# Patient Record
Sex: Male | Born: 1970 | Race: Black or African American | Hispanic: No | Smoking: Never smoker
Health system: Southern US, Community
[De-identification: ages and names within clinical notes are randomized; demographics above are authoritative.]

## PROBLEM LIST (undated history)

## (undated) DIAGNOSIS — E785 Hyperlipidemia, unspecified: Secondary | ICD-10-CM

## (undated) DIAGNOSIS — R972 Elevated prostate specific antigen [PSA]: Secondary | ICD-10-CM

## (undated) DIAGNOSIS — I10 Essential (primary) hypertension: Secondary | ICD-10-CM

## (undated) DIAGNOSIS — IMO0001 Reserved for inherently not codable concepts without codable children: Secondary | ICD-10-CM

## (undated) DIAGNOSIS — C801 Malignant (primary) neoplasm, unspecified: Secondary | ICD-10-CM

## (undated) DIAGNOSIS — R030 Elevated blood-pressure reading, without diagnosis of hypertension: Secondary | ICD-10-CM

## (undated) HISTORY — DX: Hyperlipidemia, unspecified: E78.5

## (undated) HISTORY — DX: Elevated blood-pressure reading, without diagnosis of hypertension: R03.0

## (undated) HISTORY — DX: Reserved for inherently not codable concepts without codable children: IMO0001

## (undated) HISTORY — PX: WISDOM TOOTH EXTRACTION: SHX21

## (undated) HISTORY — PX: PROSTATE BIOPSY: SHX241

---
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
## 2003-07-21 ENCOUNTER — Emergency Department (HOSPITAL_COMMUNITY): Admission: EM | Admit: 2003-07-21 | Discharge: 2003-07-22 | Payer: Self-pay | Admitting: Emergency Medicine

## 2003-07-25 ENCOUNTER — Ambulatory Visit (HOSPITAL_BASED_OUTPATIENT_CLINIC_OR_DEPARTMENT_OTHER): Admission: RE | Admit: 2003-07-25 | Discharge: 2003-07-25 | Payer: Self-pay | Admitting: Orthopedic Surgery

## 2005-02-12 ENCOUNTER — Emergency Department (HOSPITAL_COMMUNITY): Admission: EM | Admit: 2005-02-12 | Discharge: 2005-02-12 | Payer: Self-pay | Admitting: Emergency Medicine

## 2005-02-16 ENCOUNTER — Emergency Department (HOSPITAL_COMMUNITY): Admission: EM | Admit: 2005-02-16 | Discharge: 2005-02-16 | Payer: Self-pay | Admitting: Emergency Medicine

## 2005-04-30 ENCOUNTER — Emergency Department (HOSPITAL_COMMUNITY): Admission: EM | Admit: 2005-04-30 | Discharge: 2005-04-30 | Payer: Self-pay | Admitting: Emergency Medicine

## 2012-02-23 ENCOUNTER — Emergency Department (HOSPITAL_COMMUNITY): Payer: No Typology Code available for payment source

## 2012-02-23 ENCOUNTER — Encounter (HOSPITAL_COMMUNITY): Payer: Self-pay | Admitting: *Deleted

## 2012-02-23 ENCOUNTER — Emergency Department (HOSPITAL_COMMUNITY)
Admission: EM | Admit: 2012-02-23 | Discharge: 2012-02-23 | Disposition: A | Discharge status: Home / Self Care | Payer: No Typology Code available for payment source | Attending: Emergency Medicine | Admitting: Emergency Medicine

## 2012-02-23 DIAGNOSIS — Y92410 Unspecified street and highway as the place of occurrence of the external cause: Secondary | ICD-10-CM | POA: Insufficient documentation

## 2012-02-23 DIAGNOSIS — Y9389 Activity, other specified: Secondary | ICD-10-CM | POA: Insufficient documentation

## 2012-02-23 DIAGNOSIS — S0993XA Unspecified injury of face, initial encounter: Secondary | ICD-10-CM | POA: Insufficient documentation

## 2012-02-23 DIAGNOSIS — S161XXA Strain of muscle, fascia and tendon at neck level, initial encounter: Secondary | ICD-10-CM

## 2012-02-23 DIAGNOSIS — S139XXA Sprain of joints and ligaments of unspecified parts of neck, initial encounter: Secondary | ICD-10-CM | POA: Insufficient documentation

## 2012-02-23 MED ORDER — HYDROCODONE-ACETAMINOPHEN 5-325 MG PO TABS: 1.0000 | ORAL_TABLET | Freq: Four times a day (QID) | ORAL | Status: DC | PRN

## 2012-02-23 MED ORDER — IBUPROFEN 600 MG PO TABS: 600.0000 mg | ORAL_TABLET | Freq: Three times a day (TID) | ORAL | Status: DC | PRN

## 2012-02-23 MED ORDER — HYDROCODONE-ACETAMINOPHEN 5-325 MG PO TABS
2.0000 | ORAL_TABLET | Freq: Once | ORAL | Status: AC
  Administered 2012-02-23: 2 via ORAL
  Filled 2012-02-23: qty 2
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
## 2012-05-20 ENCOUNTER — Ambulatory Visit: Payer: 59 | Admitting: Diagnostic Neuroimaging

## 2012-05-27 ENCOUNTER — Encounter: Payer: Self-pay | Admitting: Diagnostic Neuroimaging

## 2012-05-27 ENCOUNTER — Ambulatory Visit (INDEPENDENT_AMBULATORY_CARE_PROVIDER_SITE_OTHER): Payer: 59 | Admitting: Diagnostic Neuroimaging

## 2012-05-27 VITALS — BP 144/86 | HR 69 | Temp 98.7°F | Ht 72.0 in | Wt 220.0 lb

## 2012-05-27 DIAGNOSIS — F0781 Postconcussional syndrome: Secondary | ICD-10-CM
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
## 2012-05-27 NOTE — Patient Instructions (Signed)
Post-Concussion Syndrome Post-concussion syndrome describes the symptoms that can occur after a head injury. These symptoms can last from weeks to months. CAUSES  It is not clear why some head injuries cause post-concussion syndrome. It can occur whether your head injury was mild or severe and whether you were wearing head protection or not.  SYMPTOMS  Memory difficulties.  Dizziness.  Headaches.  Double vision or blurry vision.  Sensitivity to light.  Hearing difficulties.  Depression.  Tiredness.  Weakness.  Difficulty with concentration.  Difficulty sleeping or staying asleep.  Vomiting. DIAGNOSIS  There is no test to determine whether you have post-concussion syndrome. Your caregiver may order an imaging scan of your brain, such as a CT scan, to check for other problems that may be causing your symptoms (such as severe injury inside your skull). TREATMENT  Usually, these problems disappear over time without medical care. Your caregiver may prescribe medicine to help ease your symptoms. It is important to follow up with a neurologist to evaluate your recovery and address any lingering symptoms or issues. HOME CARE INSTRUCTIONS   Only take over-the-counter or prescription medicines for pain, discomfort, or fever as directed by your caregiver. Do not take aspirin. Aspirin can slow blood clotting.  Sleep with your head slightly elevated to help with headaches.  Avoid any situation where there is potential for another head injury (football, hockey, martial arts, horseback riding). Your condition will get worse every time you experience a concussion. You should avoid these activities until you are evaluated by the appropriate follow-up caregivers.  Keep all follow-up appointments as directed by your caregiver.
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
## 2022-05-13 DIAGNOSIS — Z125 Encounter for screening for malignant neoplasm of prostate: Secondary | ICD-10-CM | POA: Diagnosis not present

## 2022-05-13 DIAGNOSIS — Z23 Encounter for immunization: Secondary | ICD-10-CM | POA: Diagnosis not present

## 2022-05-13 DIAGNOSIS — Z0000 Encounter for general adult medical examination without abnormal findings: Secondary | ICD-10-CM | POA: Diagnosis not present

## 2022-05-13 DIAGNOSIS — Z13220 Encounter for screening for lipoid disorders: Secondary | ICD-10-CM | POA: Diagnosis not present

## 2022-06-10 DIAGNOSIS — R972 Elevated prostate specific antigen [PSA]: Secondary | ICD-10-CM | POA: Diagnosis not present

## 2022-06-11 ENCOUNTER — Other Ambulatory Visit: Payer: Self-pay | Admitting: Urology

## 2022-06-11 DIAGNOSIS — R972 Elevated prostate specific antigen [PSA]: Secondary | ICD-10-CM

## 2022-07-29 ENCOUNTER — Other Ambulatory Visit: Payer: Self-pay

## 2022-08-06 ENCOUNTER — Ambulatory Visit
Admission: RE | Admit: 2022-08-06 | Discharge: 2022-08-06 | Disposition: A | Discharge status: Home / Self Care | Payer: BC Managed Care – PPO | Source: Ambulatory Visit | Attending: Urology | Admitting: Urology

## 2022-08-06 DIAGNOSIS — R972 Elevated prostate specific antigen [PSA]: Secondary | ICD-10-CM | POA: Diagnosis not present

## 2022-08-06 DIAGNOSIS — N429 Disorder of prostate, unspecified: Secondary | ICD-10-CM | POA: Diagnosis not present

## 2022-08-06 DIAGNOSIS — N400 Enlarged prostate without lower urinary tract symptoms: Secondary | ICD-10-CM | POA: Diagnosis not present

## 2022-08-06 MED ORDER — GADOPICLENOL 0.5 MMOL/ML IV SOLN
10.0000 mL | Freq: Once | INTRAVENOUS | Status: AC | PRN
  Administered 2022-08-06: 10 mL via INTRAVENOUS

## 2022-08-19 DIAGNOSIS — R972 Elevated prostate specific antigen [PSA]: Secondary | ICD-10-CM | POA: Diagnosis not present

## 2022-08-19 DIAGNOSIS — N4289 Other specified disorders of prostate: Secondary | ICD-10-CM | POA: Diagnosis not present

## 2022-08-19 DIAGNOSIS — D075 Carcinoma in situ of prostate: Secondary | ICD-10-CM | POA: Diagnosis not present

## 2022-08-19 DIAGNOSIS — C61 Malignant neoplasm of prostate: Secondary | ICD-10-CM | POA: Diagnosis not present

## 2022-08-26 DIAGNOSIS — C61 Malignant neoplasm of prostate: Secondary | ICD-10-CM | POA: Diagnosis not present

## 2022-09-03 ENCOUNTER — Telehealth: Payer: Self-pay

## 2022-09-03 NOTE — Telephone Encounter (Signed)
I called pt to introduce myself as the Coordinator of the Prostate [facility].   1. I confirmed with the patient he is aware of his referral to the clinic 8/27, arriving @ 12:30 pm.    2. I discussed the format of the clinic and the physicians he will be seeing that day.   3. I discussed where the clinic is located and how to contact me.   4. I confirmed his address and informed him I would be mailing a packet of information and forms to be completed. I asked him to bring them with him the day of his appointment.    He voiced understanding of the above. I asked him to call me if he has any questions or concerns regarding his appointments or the forms he needs to complete.

## 2022-09-14 DIAGNOSIS — D122 Benign neoplasm of ascending colon: Secondary | ICD-10-CM | POA: Diagnosis not present

## 2022-09-14 DIAGNOSIS — Z1211 Encounter for screening for malignant neoplasm of colon: Secondary | ICD-10-CM | POA: Diagnosis not present

## 2022-09-22 ENCOUNTER — Encounter: Payer: Self-pay | Admitting: Radiation Oncology

## 2022-09-22 ENCOUNTER — Ambulatory Visit
Admission: RE | Admit: 2022-09-22 | Discharge: 2022-09-22 | Disposition: A | Discharge status: Home / Self Care | Payer: BC Managed Care – PPO | Source: Ambulatory Visit | Attending: Radiation Oncology | Admitting: Radiation Oncology

## 2022-09-22 ENCOUNTER — Encounter: Payer: Self-pay | Admitting: Urology

## 2022-09-22 VITALS — BP 136/74 | HR 64 | Temp 97.3°F | Resp 18 | Ht 72.0 in | Wt 205.0 lb

## 2022-09-22 DIAGNOSIS — Z191 Hormone sensitive malignancy status: Secondary | ICD-10-CM | POA: Diagnosis not present

## 2022-09-22 DIAGNOSIS — C61 Malignant neoplasm of prostate: Secondary | ICD-10-CM

## 2022-09-22 HISTORY — DX: Elevated prostate specific antigen (PSA): R97.20

## 2022-09-22 NOTE — Progress Notes (Signed)
                               Care Plan Summary  Name: Joshua Bautista DOB: Feb 19, 1970   Your Medical Team:   Urologist -  Dr. [name], [facility]  Radiation Oncologist - Dr. Margaretmary Dys, [facility]     Recommendations: 1) Radiation   2) Surgery    * These recommendations are based on information available as of today's consult.      Recommendations may change depending on the results of further tests or exams.    Next Steps: 1) Consider all your options and contact Marisue Ivan, your nurse navigator, with any questions or treatment decision.      When appointments need to be scheduled, you will be contacted by [facility] and/or [facility].  Questions?  Please do not hesitate to call Cherlyn Cushing, BSN, RN at [phone] with any questions or concerns.  Marisue Ivan is your Oncology Nurse Navigator and is available to assist you while you're receiving your medical care at [facility].

## 2022-09-22 NOTE — Consult Note (Signed)
Multi-Disciplinary Clinic     09/22/2022   --------------------------------------------------------------------------------   Joshua Bautista  MRN: [id number]  DOB: December 07, 1970, 52 year old Male  SSN: [id number]   PRIMARY CARE:     REFERRING:  Renford Dills  PROVIDER:  Modena Slater, Radene Knee, M.D.  TREATING:  [name], M.D.  LOCATION:  [facility] [phone]     --------------------------------------------------------------------------------   CC/HPI: CC: Prostate Cancer   Physician requesting consult: Dr. Verdia Kuba  PCP: Dr. Renford Dills  Location of consult: [facility] - Prostate Cancer Multidisciplinary [facility]   Joshua Bautista is a 52 year old gentleman who was found to have an elevated PSA of 6.79 prompting an MRI of the prostate on 08/06/22 that confirmed a 9 mm PI-RADS 3 lesion of the left mid/base peripheral zone. An MR/US fusion biopsy was then performed on 08/19/22 that confirmed Gleason 3+4=7 adenocarcinoma with 5 out of 15 biopsies positive for malignancy including 4 out of 12 systematic biopsy cores and 1 out of 3 targeted biopsies.   Family history: None.   Imaging studies: MRI (08/06/22) - No EPE, SVI, LAD, or bone lesions.   PMH: He has no medical comorbidities.  PSH: No abdominal surgeries.   TNM stage: cT1c N0 Mx  PSA: 6.79  Gleason score: 3+4=7 (GG 2)  Biopsy (08/19/22): 5/15 cores positive  Left: L lateral apex (10%, 3+4=7), L apex (20%, 3+4=7), L mid (5%, 3+4=7), L lateral base (5%, 3+3=6)  Right: Benign  ROI: 1/3 cores (5%, 3+3=6)  Prostate volume: 73.7 cc   Nomogram  OC disease: 68%  EPE: 30%  SVI: 3%  LNI: 4%  PFS (5 year, 10 year): 82%, 71%   Urinary function: IPSS is 11.  Erectile function: SHIM score is 25.     ALLERGIES: No Allergies    MEDICATIONS: No Medications    GU PSH: Prostate Needle Biopsy - 08/19/2022     NON-GU PSH: Surgical Pathology, Gross And Microscopic Examination For Prostate Needle -  08/19/2022 Visit Complexity (formerly GPC1X) - 06/10/2022     GU PMH: Prostate Cancer - 08/26/2022 Elevated PSA - 08/19/2022, - 06/10/2022 Encounter for Prostate Cancer screening - 06/10/2022    NON-GU PMH: No Non-GU PMH    FAMILY HISTORY: 2 daughters - Daughter 2 sons - Son   SOCIAL HISTORY: Marital Status: Divorced Does not use smokeless tobacco. Has never drank.  Drinks 1 caffeinated drink per day.    REVIEW OF SYSTEMS:    GU Review Male:   Patient denies frequent urination, hard to postpone urination, burning/ pain with urination, get up at night to urinate, leakage of urine, stream starts and stops, trouble starting your streams, and have to strain to urinate .  Gastrointestinal (Upper):   Patient denies nausea and vomiting.  Gastrointestinal (Lower):   Patient denies diarrhea and constipation.  Constitutional:   Patient denies fever, night sweats, weight loss, and fatigue.  Skin:   Patient denies skin rash/ lesion and itching.  Eyes:   Patient denies double vision and blurred vision.  Ears/ Nose/ Throat:   Patient denies sore throat and sinus problems.  Hematologic/Lymphatic:   Patient denies swollen glands and easy bruising.  Cardiovascular:   Patient denies leg swelling and chest pains.  Respiratory:   Patient denies cough and shortness of breath.  Endocrine:   Patient denies excessive thirst.  Musculoskeletal:   Patient denies back pain and joint pain.  Neurological:   Patient denies headaches and dizziness.  Psychologic:   Patient denies depression and anxiety.   VITAL SIGNS: None   MULTI-SYSTEM PHYSICAL EXAMINATION:    Constitutional: Well-nourished. No physical deformities. Normally developed. Good grooming.     Complexity of Data:  Lab Test Review:   PSA  Records Review:   Pathology Reports, Previous Patient Records   06/10/22  PSA  Total PSA 6.79 ng/mL    PROCEDURES: None   ASSESSMENT:      ICD-10 Details  1 GU:   Prostate Cancer - C61    PLAN:            Document Letter(s):  Created for Patient: Clinical Summary         Notes:   1. Favorable intermediate risk prostate cancer: I had a detailed discussion with Joshua Bautista today regarding his prostate cancer diagnosis. The patient was counseled about the natural history of prostate cancer and the standard treatment options that are available for prostate cancer. It was explained to him how his age and life expectancy, clinical stage, Gleason score/prognostic grade group, and PSA (and PSA density) affect his prognosis, the decision to proceed with additional staging studies, as well as how that information influences recommended treatment strategies. We discussed the roles for active surveillance, radiation therapy, surgical therapy, androgen deprivation, as well as ablative therapy and other investigational options for the treatment of prostate cancer as appropriate to his individual cancer situation. We discussed the risks and benefits of these options with regard to their impact on cancer control and also in terms of potential adverse events, complications, and impact on quality of life particularly related to urinary and sexual function. The patient was encouraged to ask questions throughout the discussion today and all questions were answered to his stated satisfaction. In addition, the patient was provided with and/or directed to appropriate resources and literature for further education about prostate cancer and treatment options. We discussed surgical therapy for prostate cancer including the different available surgical approaches. We discussed, in detail, the risks and expectations of surgery with regard to cancer control, urinary control, and erectile function as well as the expected postoperative recovery process. Additional risks of surgery including but not limited to bleeding, infection, hernia formation, nerve damage, lymphocele formation, bowel/rectal injury potentially necessitating colostomy,  damage to the urinary tract resulting in urine leakage, urethral stricture, and the cardiopulmonary risks such as myocardial infarction, stroke, death, venothromboembolism, etc. were explained. The risk of open surgical conversion for robotic/laparoscopic prostatectomy was also discussed.   He is well-informed and did meet with Dr. Kathrynn Running earlier today. He appears to be leaning toward surgical therapy but is not ready to make a final decision. If he elects to proceed with surgery, he is interested in possibly delaying this until January. My tentative plan would be to perform a bilateral nerve sparing robot-assisted laparoscopic radical prostatectomy and bilateral pelvic lymphadenectomy. I will plan open follow-up with me preoperatively for an exam within a month of his prostatectomy.   CC: Dr. Windy Fast polite  Dr. Verdia Kuba  Dr. Margaretmary Dys    E & M CODES: We spent 52 minutes dedicated to evaluation and management time, including face to face interaction, discussions on coordination of care, documentation, result review, and discussion with others as applicable.

## 2022-09-22 NOTE — Progress Notes (Signed)
Radiation Oncology         [phone]) [phone] ________________________________  Multidisciplinary Prostate Cancer Clinic  Initial Radiation Oncology Consultation  Name: Joshua Bautista MRN: [id number]  Date: 09/22/2022  DOB: 03/23/1970  WU:[id number], Windy Fast, MD  Crista Elliot, MD   REFERRING PHYSICIAN: Crista Elliot, MD  DIAGNOSIS: 52 y.o. gentleman with stage T1c adenocarcinoma of the prostate with a Gleason's score of 3+4 and a PSA of 6.79    ICD-10-CM   1. Malignant neoplasm of prostate (HCC)  C61       HISTORY OF PRESENT ILLNESS::Joshua Bautista is a 52 y.o. gentleman.  He was noted to have an elevated PSA of 9.83 on routine labs by his primary care physician, Dr. Nehemiah Settle.  Accordingly, he was referred for evaluation in urology by Dr. Alvester Morin on 06/10/22,  digital rectal examination performed at that time showed no nodules or induration. A repeat PSA obtained that day showed a decrease but remained elevated at 6.79.  Therefore, he underwent prostate MRI on 08/06/22 showing a small PI-RADS 3 lesion in the left posterolateral peripheral zone of the base and mid gland.          The patient proceeded to MRI fusion biopsy of the prostate on 08/19/22.  The prostate volume measured 73.71 cc.  Out of 15 core biopsies, 5 were positive.  The maximum Gleason score was 3+4, and this was seen in the left apex, left mid (small focus), and left apex lateral. Additionally, small foci of Gleason 3+3 were seen in the left base lateral and one sample from the MRI ROI.    The patient reviewed the biopsy results with his urologist and he has kindly been referred today to the multidisciplinary prostate cancer clinic for presentation of pathology and radiology studies in our conference for discussion of potential radiation treatment options and clinical evaluation.  PREVIOUS RADIATION THERAPY: No  PAST MEDICAL HISTORY:  has a past medical history of Elevated blood pressure and Hyperlipidemia.     PAST SURGICAL HISTORY:No past surgical history on file.  FAMILY HISTORY: family history is not on file.  SOCIAL HISTORY:  reports that he has never smoked. He does not have any smokeless tobacco history on file. He reports current alcohol use. He reports that he does not use drugs.  ALLERGIES: Patient has no known allergies.  MEDICATIONS:  Current Outpatient Medications  Medication Sig Dispense Refill   ibuprofen (ADVIL,MOTRIN) 600 MG tablet Take 1 tablet (600 mg total) by mouth every 8 (eight) hours as needed for pain. Take with food. 20 tablet 0   No current facility-administered medications for this encounter.    REVIEW OF SYSTEMS:  On review of systems, the patient reports that he is doing well overall. He denies any chest pain, shortness of breath, cough, fevers, chills, night sweats, unintended weight changes. He denies any bowel disturbances, and denies abdominal pain, nausea or vomiting. He denies any new musculoskeletal or joint aches or pains. His IPSS was 11, indicating moderate urinary symptoms. His SHIM was 25, indicating he does not have erectile dysfunction. A complete review of systems is obtained and is otherwise negative.   PHYSICAL EXAM:  Wt Readings from Last 3 Encounters:  05/27/12 220 lb (99.8 kg)   Temp Readings from Last 3 Encounters:  05/27/12 98.7 F (37.1 C) (Oral)  02/23/12 97.4 F (36.3 C) (Oral)   BP Readings from Last 3 Encounters:  05/27/12 (!) 144/86  02/23/12 137/81   Pulse Readings from  Last 3 Encounters:  05/27/12 69  02/23/12 86    /10  In general this is a well appearing African-American man in no acute distress. He's alert and oriented x4 and appropriate throughout the examination. Cardiopulmonary assessment is negative for acute distress and he exhibits normal effort.    KPS = 100  100 - Normal; no complaints; no evidence of disease. 90   - Able to carry on normal activity; minor signs or symptoms of disease. 80   - Normal  activity with effort; some signs or symptoms of disease. 55   - Cares for self; unable to carry on normal activity or to do active work. 60   - Requires occasional assistance, but is able to care for most of his personal needs. 50   - Requires considerable assistance and frequent medical care. 40   - Disabled; requires special care and assistance. 30   - Severely disabled; hospital admission is indicated although death not imminent. 20   - Very sick; hospital admission necessary; active supportive treatment necessary. 10   - Moribund; fatal processes progressing rapidly. 0     - Dead  Karnofsky DA, Abelmann WH, Craver LS and Burchenal JH [phone]) The use of the nitrogen mustards in the palliative treatment of carcinoma: with particular reference to bronchogenic carcinoma Cancer 1 [phone]   LABORATORY DATA:  No results found for: "WBC", "HGB", "HCT", "MCV", "PLT" No results found for: "NA", "K", "CL", "CO2" No results found for: "ALT", "AST", "GGT", "ALKPHOS", "BILITOT"   RADIOGRAPHY: No results found.    IMPRESSION/PLAN: 52 y.o. gentleman with Stage T1c adenocarcinoma of the prostate with a Gleason score of 3+4 and a PSA of 6.79.    We discussed the patient's workup and outlined the nature of prostate cancer in this setting. The patient's T stage, Gleason's score, and PSA put him into the favorable intermediate risk group. Accordingly, he is eligible for a variety of potential treatment options including brachytherapy, 5.5 weeks of external radiation, or prostatectomy. We discussed the available radiation techniques, and focused on the details and logistics of delivery. The patient is not an ideal candidate for brachytherapy with a prostate volume of 74 cc.  Therefore, we discussed and outlined the risks, benefits, short and long-term effects associated with daily external beam radiotherapy and compared and contrasted these with prostatectomy. We discussed the role of SpaceOAR gel in reducing the  rectal toxicity associated with radiotherapy.   The patient focused most of his questions and interest in robotic-assisted laparoscopic radical prostatectomy.  We discussed some of the potential advantages of surgery including surgical staging, the availability of salvage radiotherapy to the prostatic fossa, and the confidence associated with immediate biochemical response.  We discussed some of the potential proven indications for postoperative radiotherapy including positive margins, extracapsular extension, and seminal vesicle involvement. We also talked about some of the other potential findings leading to a recommendation for radiotherapy including a non-zero postoperative PSA and positive lymph nodes. He appears to have a good understanding of his disease and our treatment recommendations which are of curative intent.  He was encouraged to ask questions that were answered to his stated satisfaction.  At the end of the conversation the patient remains undecided but appears to be leaning towards proceeding with robotic assisted laparoscopic prostatectomy.  He would like to take some additional time to consider his options prior to committing to treatment and he has our contact information so that he can let us know once he reaches a  final decision and we will proceed with treatment planning accordingly at that time.  We enjoyed meeting him today look forward to continuing to participate in his care should he ultimately elect to proceed with 5 and half weeks of daily external beam radiation.  He knows that he is welcome to call at anytime with any further questions or concerns related to the treatment options discussed today.  We personally spent 60 minutes in this encounter including chart review, reviewing radiological studies, meeting face-to-face with the patient, entering orders and completing documentation.    Marguarite Arbour, PA-C    Margaretmary Dys, MD  [facility]  Radiation  Oncology Direct Dial: [phone]  Fax: [phone] [email or web address]  Skype  LinkedIn   This document serves as a record of services personally performed by Margaretmary Dys, MD and Marcello Fennel, PA-C. It was created on their behalf by Mickie Bail, a trained medical scribe. The creation of this record is based on the scribe's personal observations and the provider's statements to them. This document has been checked and approved by the attending provider.

## 2022-09-23 ENCOUNTER — Emergency Department (HOSPITAL_BASED_OUTPATIENT_CLINIC_OR_DEPARTMENT_OTHER)
Admission: EM | Admit: 2022-09-23 | Discharge: 2022-09-23 | Disposition: A | Discharge status: Home / Self Care | Payer: BC Managed Care – PPO | Attending: Emergency Medicine | Admitting: Emergency Medicine

## 2022-09-23 ENCOUNTER — Emergency Department (HOSPITAL_BASED_OUTPATIENT_CLINIC_OR_DEPARTMENT_OTHER): Payer: BC Managed Care – PPO

## 2022-09-23 ENCOUNTER — Encounter (HOSPITAL_BASED_OUTPATIENT_CLINIC_OR_DEPARTMENT_OTHER): Payer: Self-pay

## 2022-09-23 ENCOUNTER — Other Ambulatory Visit (HOSPITAL_BASED_OUTPATIENT_CLINIC_OR_DEPARTMENT_OTHER): Payer: Self-pay

## 2022-09-23 ENCOUNTER — Other Ambulatory Visit: Payer: Self-pay

## 2022-09-23 DIAGNOSIS — K529 Noninfective gastroenteritis and colitis, unspecified: Secondary | ICD-10-CM | POA: Diagnosis not present

## 2022-09-23 DIAGNOSIS — Z8546 Personal history of malignant neoplasm of prostate: Secondary | ICD-10-CM | POA: Diagnosis not present

## 2022-09-23 DIAGNOSIS — R109 Unspecified abdominal pain: Secondary | ICD-10-CM | POA: Diagnosis not present

## 2022-09-23 DIAGNOSIS — R1010 Upper abdominal pain, unspecified: Secondary | ICD-10-CM | POA: Diagnosis not present

## 2022-09-23 DIAGNOSIS — N200 Calculus of kidney: Secondary | ICD-10-CM | POA: Diagnosis not present

## 2022-09-23 DIAGNOSIS — I10 Essential (primary) hypertension: Secondary | ICD-10-CM | POA: Insufficient documentation

## 2022-09-23 DIAGNOSIS — K8020 Calculus of gallbladder without cholecystitis without obstruction: Secondary | ICD-10-CM | POA: Diagnosis not present

## 2022-09-23 DIAGNOSIS — K429 Umbilical hernia without obstruction or gangrene: Secondary | ICD-10-CM | POA: Diagnosis not present

## 2022-09-23 LAB — CBC WITH DIFFERENTIAL/PLATELET
Abs Immature Granulocytes: 0.01 10*3/uL (ref 0.00–0.07)
Basophils Absolute: 0 10*3/uL (ref 0.0–0.1)
Basophils Relative: 0 %
Eosinophils Absolute: 0 10*3/uL (ref 0.0–0.5)
Eosinophils Relative: 0 %
HCT: 41.7 % (ref 39.0–52.0)
Hemoglobin: 14.3 g/dL (ref 13.0–17.0)
Immature Granulocytes: 0 %
Lymphocytes Relative: 19 %
Lymphs Abs: 1 10*3/uL (ref 0.7–4.0)
MCH: 29.7 pg (ref 26.0–34.0)
MCHC: 34.3 g/dL (ref 30.0–36.0)
MCV: 86.5 fL (ref 80.0–100.0)
Monocytes Absolute: 0.2 10*3/uL (ref 0.1–1.0)
Monocytes Relative: 4 %
Neutro Abs: 4.2 10*3/uL (ref 1.7–7.7)
Neutrophils Relative %: 77 %
Platelets: 193 10*3/uL (ref 150–400)
RBC: 4.82 MIL/uL (ref 4.22–5.81)
RDW: 13.4 % (ref 11.5–15.5)
WBC: 5.5 10*3/uL (ref 4.0–10.5)
nRBC: 0 % (ref 0.0–0.2)

## 2022-09-23 LAB — COMPREHENSIVE METABOLIC PANEL
ALT: 10 U/L (ref 0–44)
AST: 13 U/L — ABNORMAL LOW (ref 15–41)
Albumin: 4.1 g/dL (ref 3.5–5.0)
Alkaline Phosphatase: 46 U/L (ref 38–126)
Anion gap: 8 (ref 5–15)
BUN: 15 mg/dL (ref 6–20)
CO2: 27 mmol/L (ref 22–32)
Calcium: 9.4 mg/dL (ref 8.9–10.3)
Chloride: 105 mmol/L (ref 98–111)
Creatinine, Ser: 1.17 mg/dL (ref 0.61–1.24)
GFR, Estimated: >60 mL/min (ref >60)
Glucose, Bld: 131 mg/dL — ABNORMAL HIGH (ref 70–99)
Potassium: 3.9 mmol/L (ref 3.5–5.1)
Sodium: 140 mmol/L (ref 135–145)
Total Bilirubin: 0.7 mg/dL (ref 0.3–1.2)
Total Protein: 7.9 g/dL (ref 6.5–8.1)

## 2022-09-23 LAB — URINALYSIS, ROUTINE W REFLEX MICROSCOPIC
Bilirubin Urine: NEGATIVE
Glucose, UA: NEGATIVE mg/dL
Hgb urine dipstick: NEGATIVE
Ketones, ur: NEGATIVE mg/dL
Leukocytes,Ua: NEGATIVE
Nitrite: NEGATIVE
Protein, ur: NEGATIVE mg/dL
Specific Gravity, Urine: 1.019 (ref 1.005–1.030)
pH: 7.5 (ref 5.0–8.0)

## 2022-09-23 LAB — LACTIC ACID, PLASMA: Lactic Acid, Venous: 1.7 mmol/L (ref 0.5–1.9)

## 2022-09-23 LAB — LIPASE, BLOOD: Lipase: 42 U/L (ref 11–51)

## 2022-09-23 MED ORDER — IOHEXOL 300 MG/ML  SOLN
100.0000 mL | Freq: Once | INTRAMUSCULAR | Status: AC | PRN
  Administered 2022-09-23: 100 mL via INTRAVENOUS

## 2022-09-23 MED ORDER — FENTANYL CITRATE PF 50 MCG/ML IJ SOSY
50.0000 ug | PREFILLED_SYRINGE | Freq: Once | INTRAMUSCULAR | Status: AC
  Administered 2022-09-23: 50 ug via INTRAVENOUS
  Filled 2022-09-23: qty 1

## 2022-09-23 MED ORDER — MORPHINE SULFATE (PF) 4 MG/ML IV SOLN
4.0000 mg | Freq: Once | INTRAVENOUS | Status: AC
  Administered 2022-09-23: 4 mg via INTRAVENOUS
  Filled 2022-09-23: qty 1

## 2022-09-23 MED ORDER — DICYCLOMINE HCL 10 MG PO CAPS
10.0000 mg | ORAL_CAPSULE | Freq: Once | ORAL | Status: AC
  Administered 2022-09-23: 10 mg via ORAL
  Filled 2022-09-23: qty 1

## 2022-09-23 MED ORDER — IBUPROFEN 600 MG PO TABS: 600.0000 mg | ORAL_TABLET | Freq: Four times a day (QID) | ORAL | 0 refills | Status: DC | PRN

## 2022-09-23 MED ORDER — SULFAMETHOXAZOLE-TRIMETHOPRIM 800-160 MG PO TABS: 1.0000 | ORAL_TABLET | Freq: Two times a day (BID) | ORAL | 0 refills | Status: AC

## 2022-09-23 MED ORDER — OXYCODONE-ACETAMINOPHEN 5-325 MG PO TABS
1.0000 | ORAL_TABLET | Freq: Once | ORAL | Status: AC
  Administered 2022-09-23: 1 via ORAL
  Filled 2022-09-23: qty 1

## 2022-09-23 MED ORDER — KETOROLAC TROMETHAMINE 15 MG/ML IJ SOLN
15.0000 mg | Freq: Once | INTRAMUSCULAR | Status: AC
  Administered 2022-09-23: 15 mg via INTRAVENOUS
  Filled 2022-09-23: qty 1

## 2022-09-23 MED ORDER — SODIUM CHLORIDE 0.9 % IV SOLN
Freq: Once | INTRAVENOUS | Status: AC
Start: 2022-09-23 — End: 2022-09-23

## 2022-09-23 MED ORDER — OXYCODONE HCL 5 MG PO TABS: 5.0000 mg | ORAL_TABLET | Freq: Four times a day (QID) | ORAL | 0 refills | Status: DC | PRN

## 2022-09-23 MED ORDER — ONDANSETRON HCL 4 MG/2ML IJ SOLN
4.0000 mg | Freq: Once | INTRAMUSCULAR | Status: AC
  Administered 2022-09-23: 4 mg via INTRAVENOUS
  Filled 2022-09-23: qty 2

## 2022-09-23 MED ORDER — SODIUM CHLORIDE 0.9 % IV SOLN
3.0000 g | Freq: Once | INTRAVENOUS | Status: AC
  Administered 2022-09-23: 3 g via INTRAVENOUS

## 2022-09-23 NOTE — ED Notes (Signed)
Recollect light green sent to lab.  

## 2022-09-23 NOTE — ED Notes (Signed)
 RN reviewed discharge instructions with pt. Pt verbalized understanding and had no further questions. VSS upon discharge.  

## 2022-09-23 NOTE — ED Provider Notes (Signed)
[place] EMERGENCY DEPARTMENT AT [facility] Provider Note   CSN: [id number] Arrival date & time: 09/23/22  1027     History  Chief Complaint  Patient presents with   Abdominal Pain    Joshua Bautista is a 52 y.o. male.   Abdominal Pain   52 year old male presents emergency department with complaints of upper abdominal pain.  Patient reports acute onset upper abdominal pain around 4 AM this morning.  Reports pain mostly right upper abdomen with some radiation across his upper abdomen.  States he has not had symptoms like this before.  Reports feelings of nausea with no emesis.  Denies any fever, chills, chest pain, shortness of breath, urinary symptoms, change in bowel habits.  Patient reports last bowel movement this morning and regular per patient.  Denies any history of abdominal surgeries.  Past medical history significant for malignant prostate cancer, hyperlipidemia, hypertension  Home Medications Prior to Admission medications   Medication Sig Start Date End Date Taking? Authorizing Provider  ibuprofen (ADVIL) 600 MG tablet Take 1 tablet (600 mg total) by mouth every 6 (six) hours as needed. 09/23/22  Yes Sherian Maroon A, PA  oxyCODONE (ROXICODONE) 5 MG immediate release tablet Take 1 tablet (5 mg total) by mouth every 6 (six) hours as needed for severe pain. 09/23/22  Yes Sherian Maroon A, PA  sulfamethoxazole-trimethoprim (BACTRIM DS) 800-160 MG tablet Take 1 tablet by mouth 2 (two) times daily for 6 days. 09/23/22 09/29/22 Yes Peter Garter, PA      Allergies    Patient has no known allergies.    Review of Systems   Review of Systems  Gastrointestinal:  Positive for abdominal pain.  All other systems reviewed and are negative.   Physical Exam Updated Vital Signs BP (!) 143/83   Pulse 62   Temp 98.6 F (37 C) (Oral)   Resp 19   Ht 6' (1.829 m)   Wt 93 kg   SpO2 96%   BMI 27.80 kg/m  Physical Exam Vitals and nursing note reviewed.   Constitutional:      General: He is not in acute distress.    Appearance: He is well-developed.  HENT:     Head: Normocephalic and atraumatic.  Eyes:     Conjunctiva/sclera: Conjunctivae normal.  Cardiovascular:     Rate and Rhythm: Normal rate and regular rhythm.     Heart sounds: No murmur heard. Pulmonary:     Effort: Pulmonary effort is normal. No respiratory distress.     Breath sounds: Normal breath sounds.  Abdominal:     Palpations: Abdomen is soft.     Tenderness: There is abdominal tenderness in the right upper quadrant. There is guarding. There is no right CVA tenderness, left CVA tenderness or rebound.  Musculoskeletal:        General: No swelling.     Cervical back: Neck supple.  Skin:    General: Skin is warm and dry.     Capillary Refill: Capillary refill takes less than 2 seconds.  Neurological:     Mental Status: He is alert.  Psychiatric:        Mood and Affect: Mood normal.     ED Results / Procedures / Treatments   Labs (all labs ordered are listed, but only abnormal results are displayed) Labs Reviewed  URINALYSIS, ROUTINE W REFLEX MICROSCOPIC - Abnormal; Notable for the following components:      Result Value   Color, Urine COLORLESS (*)  All other components within normal limits  COMPREHENSIVE METABOLIC PANEL - Abnormal; Notable for the following components:   Glucose, Bld 131 (*)    AST 13 (*)    All other components within normal limits  CBC WITH DIFFERENTIAL/PLATELET  LIPASE, BLOOD  LACTIC ACID, PLASMA    EKG None  Radiology CT ABDOMEN PELVIS W CONTRAST  Result Date: 09/23/2022 CLINICAL DATA:  Severe abdominal pain since this morning EXAM: CT ABDOMEN AND PELVIS WITH CONTRAST TECHNIQUE: Multidetector CT imaging of the abdomen and pelvis was performed using the standard protocol following bolus administration of intravenous contrast. RADIATION DOSE REDUCTION: This exam was performed according to the departmental dose-optimization program  which includes automated exposure control, adjustment of the mA and/or kV according to patient size and/or use of iterative reconstruction technique. CONTRAST:  OMNIPAQUE IOHEXOL 300 MG/ML  SOLN COMPARISON:  None Available. FINDINGS: Lower chest: No acute abnormality. Bibasilar scarring or atelectasis. Coronary artery calcifications. Hepatobiliary: No solid liver abnormality is seen. Gallstones with vacuum phenomenon. No gallbladder wall thickening, or biliary dilatation. Pancreas: Unremarkable. No pancreatic ductal dilatation or surrounding inflammatory changes. Spleen: Normal in size without significant abnormality. Adrenals/Urinary Tract: Adrenal glands are unremarkable. Small nonobstructive calculi of the superior pole of the right kidney. No left-sided calculi, ureteral calculi, or hydronephrosis. Numerous bilateral benign renal cortical cysts and subcentimeter lesions too small to characterize although almost certainly additional cysts, requiring no specific further follow-up or characterization. Mildly distended urinary bladder. Stomach/Bowel: Stomach is within normal limits. Appendix appears normal. Mild, long segment colon wall thickening, mucosal hyperenhancement, and adjacent vascular combing involving the hepatic flexure through the descending colon (series 2, image 37, series 5, image 82). Vascular/Lymphatic: Aortic atherosclerosis. No enlarged abdominal or pelvic lymph nodes. Reproductive: Prostatomegaly. Mild fat stranding about the prostate (series 2, image 79). Other: Fat containing umbilical hernia.  No ascites. Musculoskeletal: No acute or significant osseous findings. IMPRESSION: 1. Mild, nonspecific infectious, inflammatory, or ischemic colitis involving the hepatic flexure through the descending colon. 2. Prostatomegaly with mild fat stranding about the prostate, suggesting prostatitis. Mildly distended urinary bladder. Correlate for urinary retention. 3. Cholelithiasis without evidence  of acute cholecystitis. 4. Nonobstructive right nephrolithiasis. 5. Coronary artery disease. Aortic Atherosclerosis (ICD10-I70.0). Electronically Signed   By: Jearld Lesch M.D.   On: 09/23/2022 12:44   US Abdomen Limited RUQ (LIVER/GB)  Result Date: 09/23/2022 CLINICAL DATA:  Right upper pain EXAM: ULTRASOUND ABDOMEN LIMITED RIGHT UPPER QUADRANT COMPARISON:  None Available. FINDINGS: Gallbladder: Multiple calcified gallstones are noted measuring up to 2.2 cm as well as layering sludge. There is no gallbladder wall thickening or pericholecystic fluid. Common bile duct: Diameter: 5 mm Liver: No focal lesion identified. Within normal limits in parenchymal echogenicity. Portal vein is patent on color Doppler imaging with normal direction of blood flow towards the liver. Other: None. IMPRESSION: Cholelithiasis without evidence of acute cholecystitis. Electronically Signed   By: Lesia Hausen M.D.   On: 09/23/2022 11:33    Procedures Procedures    Medications Ordered in ED Medications  0.9 %  sodium chloride infusion (0 mLs Intravenous Stopped 09/23/22 1715)  morphine (PF) 4 MG/ML injection 4 mg (4 mg Intravenous Given 09/23/22 1059)  ondansetron (ZOFRAN) injection 4 mg (4 mg Intravenous Given 09/23/22 1058)  iohexol (OMNIPAQUE) 300 MG/ML solution 100 mL (100 mLs Intravenous Contrast Given 09/23/22 1223)  fentaNYL (SUBLIMAZE) injection 50 mcg (50 mcg Intravenous Given 09/23/22 1243)  dicyclomine (BENTYL) capsule 10 mg (10 mg Oral Given 09/23/22 1457)  Ampicillin-Sulbactam (UNASYN) 3  g in sodium chloride 0.9 % 100 mL IVPB (0 g Intravenous Stopped 09/23/22 1716)  ketorolac (TORADOL) 15 MG/ML injection 15 mg (15 mg Intravenous Given 09/23/22 1637)  oxyCODONE-acetaminophen (PERCOCET/ROXICET) 5-325 MG per tablet 1 tablet (1 tablet Oral Given 09/23/22 1636)    ED Course/ Medical Decision Making/ A&P Clinical Course as of 09/24/22 1311  Wed Sep 23, 2022  1327 CT ABDOMEN PELVIS W CONTRAST After CT scan, patient  urinated 675 cc of fluid.  Very low suspicion for urinary retention. [CR]    Clinical Course User Index [CR] Peter Garter, PA                                 Medical Decision Making Amount and/or Complexity of Data Reviewed Labs: ordered. Radiology: ordered. Decision-making details documented in ED Course.  Risk Prescription drug management.   This patient presents to the ED for concern of abdominal pain, this involves an extensive number of treatment options, and is a complaint that carries with it a high risk of complications and morbidity.  The differential diagnosis includes gastritis, PUD, pancreatitis, CBD pathology, cholecystitis, SBO/LBO, volvulus, diverticulitis, appendicitis, pyelonephritis, nephrolithiasis, cystitis, malignancy   Co morbidities that complicate the patient evaluation  See HPI   Additional history obtained:  Additional history obtained from EMR External records from outside source obtained and reviewed including hospital records   Lab Tests:  I Ordered, and personally interpreted labs.  The pertinent results include: No leukocytosis.  No evidence of anemia.  Placed within range.  No Electra abnormalities.  No transaminitis.  No renal dysfunction.  UA without abnormality.  Lipase within normal limits.  Lactic acid within normal limits.  Patient declined Hemoccult   Imaging Studies ordered:  I ordered imaging studies including CT abdomen pelvis, right upper quadrant ultrasound I independently visualized and interpreted imaging which showed  CT abdomen pelvis: Nonspecific infectious/inflammatory/ischemic colitis involving hepatic flexure of through descending colon.  Prostatomegaly with mild fat stranding around prostate.  Mildly distended bladder.  Cholelithiasis without evidence of acute cholecystitis.  Nonobstructing right nephrolithiasis.  Coronary artery disease.  Aortic atherosclerosis. Right upper quadrant ultrasound: Cholelithiasis without  evidence of cholecystitis. I agree with the radiologist interpretation  Cardiac Monitoring: / EKG:  The patient was maintained on a cardiac monitor.  I personally viewed and interpreted the cardiac monitored which showed an underlying rhythm of: Sinus rhythm   Consultations Obtained:  I requested consultation with attending physician Dr. Wallace Cullens who is in agreement with treatment plan going forward   Problem List / ED Course / Critical interventions / Medication management  Colitis I ordered medication including fentanyl, Zofran, morphine, Bentyl, Toradol, Unasyn   Reevaluation of the patient after these medicines showed that the patient improved I have reviewed the patients home medicines and have made adjustments as needed   Social Determinants of Health:  Denies tobacco, illicit drug use   Test / Admission - Considered:  Colitis Vitals signs significant for hypertension blood pressure 143/83. Otherwise within normal range and stable throughout visit. Laboratory/imaging studies significant for: See above 52 year old male presents emergency department with complaints of upper abdominal pain acute onset earlier this morning around 4 AM.  On exam, patient with more right upper quadrant abdominal tenderness but some with epigastric tenderness.  Given diffuse nature of symptoms as well as acute onset nature of abdominal pain, CT imaging was performed.  CT imaging consistent with colitis but unsure of  exact etiology.  Given patient's history, no evidence of hematochezia/melena, lactic acid within normal limits.  Desired for assessment of Hemoccult for potential blood in stool but patient declined.  Lower suspicion for ischemic cause of patient's colitis given lack of history of vascular issues with negative lactic and lack of reported hematochezia/melena.  Patient's colitis more likely secondary to inflammatory/infectious etiology.  Patient CT imaging also showed signs of possible  prostatitis.  Will place patient on antibiotic coverage for both prostatitis as well as colitis and have him follow-up for further assessment to see if more prolonged duration of antibiotics is warranted.  Will recommend dietary modifications as well as place patient on empiric antibiotic coverage and recommend follow-up with primary care for reassessment.  Patient without meeting of SIRS criteria so sepsis protocol was not performed.  Treatment plan discussed at length with patient and he acknowledged understanding was agreeable to said plan.  Patient overall well-appearing, afebrile in no acute distress. Worrisome signs and symptoms were discussed with the patient, and the patient acknowledged understanding to return to the ED if noticed. Patient was stable upon discharge.          Final Clinical Impression(s) / ED Diagnoses Final diagnoses:  Colitis    Rx / DC Orders ED Discharge Orders          Ordered    oxyCODONE (ROXICODONE) 5 MG immediate release tablet  Every 6 hours PRN        09/23/22 1628    ibuprofen (ADVIL) 600 MG tablet  Every 6 hours PRN        09/23/22 1628    sulfamethoxazole-trimethoprim (BACTRIM DS) 800-160 MG tablet  2 times daily        09/23/22 1628              Peter Garter, [place] 09/24/22 1311    Edwin Dada P, DO 09/29/22 1008

## 2022-09-23 NOTE — Discharge Instructions (Addendum)
As discussed, your CT scan showed evidence of colitis.  Will treat this with antibiotics in the outpatient setting.  See information attached regarding dietary changes for diagnosis of colitis.  Also send in anti-inflammatories to take as well as pain medicine for breakthrough pain.  Recommend follow-up with primary care for reassessment of your symptoms within 2 to 3 days.  Please do not hesitate to return to emergency department if the worrisome signs and symptoms we discussed become apparent.

## 2022-09-23 NOTE — ED Triage Notes (Signed)
In for eval of severe abd pain onset 0400. Denies N/V/D. Last BM this am and normal. Reports eating [employer] last pm at approx 2230. Wife at bedside.

## 2022-10-02 NOTE — Progress Notes (Signed)
RN spoke with patient to follow up since recent [facility].  Patient has requested additional appointments to review treatment options.  RN offered to answer any questions, however, patient prefers to have additional appointment.    Patient is scheduled for FUN with Marcello Fennel, PA-C on 10/15/22.  RN left message to confirm he is aware, and will plan to follow up after 9/19.

## 2022-10-15 ENCOUNTER — Ambulatory Visit
Admission: RE | Admit: 2022-10-15 | Discharge: 2022-10-15 | Disposition: A | Discharge status: Home / Self Care | Payer: BC Managed Care – PPO | Source: Ambulatory Visit | Attending: Urology | Admitting: Urology

## 2022-10-15 ENCOUNTER — Encounter: Payer: Self-pay | Admitting: Urology

## 2022-10-15 VITALS — Ht 72.0 in | Wt 210.0 lb

## 2022-10-15 DIAGNOSIS — C61 Malignant neoplasm of prostate: Secondary | ICD-10-CM | POA: Diagnosis not present

## 2022-10-15 DIAGNOSIS — Z191 Hormone sensitive malignancy status: Secondary | ICD-10-CM | POA: Diagnosis not present

## 2022-10-15 NOTE — Progress Notes (Addendum)
Nursing interview for stage T1c adenocarcinoma of the prostate with a Gleason's score of 3+4 and a PSA of 6.79  Patient identity verified x2.  Patient reports doing well. No issues conveyed at this time.  Meaningful use complete. No urinary management medications Urology- Dr. Alvester Morin ([facility])- Apt- pending  Vitals- Ht 6' (1.829 m)   Wt 210 lb (95.3 kg)   BMI 28.48 kg/m   This concludes the interaction.  Ruel Favors, LPN

## 2022-10-15 NOTE — Progress Notes (Signed)
Radiation Oncology         [phone] ________________________________  Radiation Oncology Re-Consult  Name: Joshua Bautista MRN: [id number]  Date: 10/15/2022  DOB: Jun 01, 1970  WU:[id number], Windy Fast, MD  Crista Elliot, MD   REFERRING PHYSICIAN: Crista Elliot, MD  DIAGNOSIS: 52 y.o. gentleman with stage T1c adenocarcinoma of the prostate with a Gleason's score of 3+4 and a PSA of 6.79    ICD-10-CM   1. Malignant neoplasm of prostate (HCC)  C61       HISTORY OF PRESENT ILLNESS::Joshua Bautista is a 52 y.o. gentleman.  He was noted to have an elevated PSA of 9.83 on routine labs by his primary care physician, Dr. Nehemiah Settle.  Accordingly, he was referred for evaluation in urology by Dr. Alvester Morin on 06/10/22,  digital rectal examination performed at that time showed no nodules or induration. A repeat PSA obtained that day showed a decrease but remained elevated at 6.79.  Therefore, he underwent prostate MRI on 08/06/22 showing a small PI-RADS 3 lesion in the left posterolateral peripheral zone of the base and mid gland.        The patient proceeded to MRI fusion biopsy of the prostate on 08/19/22.  The prostate volume measured 73.71 cc.  Out of 15 core biopsies, 5 were positive.  The maximum Gleason score was 3+4, and this was seen in the left apex, left mid (small focus), and left apex lateral. Additionally, small foci of Gleason 3+3 were seen in the left base lateral and one sample from the MRI ROI.    The patient reviewed the biopsy and imaging results with his urologist and he was kindly referred to the multidisciplinary prostate cancer clinic for presentation of pathology and radiology studies in our conference where we initially met him on 09/22/2022 to discuss potential radiation treatment options.  At that time, he was leaning towards proceeding with robotic assisted laparoscopic prostatectomy but also still considering 5.5 weeks of daily external beam radiation as he was not felt to  be an ideal candidate for brachytherapy given his large prostate volume and moderate LUTS.  He had some additional questions regarding his treatment options that we addressed today for clarification,.  PREVIOUS RADIATION THERAPY: No  PAST MEDICAL HISTORY:  has a past medical history of Elevated blood pressure, Elevated PSA, and Hyperlipidemia.    PAST SURGICAL HISTORY: Past Surgical History:  Procedure Laterality Date   PROSTATE BIOPSY      FAMILY HISTORY: family history is not on file.  SOCIAL HISTORY:  reports that he has never smoked. He has never used smokeless tobacco. He reports current alcohol use. He reports that he does not use drugs.  ALLERGIES: Patient has no known allergies.  MEDICATIONS:  Current Outpatient Medications  Medication Sig Dispense Refill   ibuprofen (ADVIL) 600 MG tablet Take 1 tablet (600 mg total) by mouth every 6 (six) hours as needed. 30 tablet 0   oxyCODONE (ROXICODONE) 5 MG immediate release tablet Take 1 tablet (5 mg total) by mouth every 6 (six) hours as needed for severe pain. 8 tablet 0   No current facility-administered medications for this visit.    REVIEW OF SYSTEMS:  On review of systems, the patient reports that he is doing well overall. He denies any chest pain, shortness of breath, cough, fevers, chills, night sweats, unintended weight changes. He denies any bowel disturbances, and denies abdominal pain, nausea or vomiting. He denies any new musculoskeletal or joint aches or pains. His  IPSS was 11, indicating moderate urinary symptoms. His SHIM was 25, indicating he does not have erectile dysfunction. A complete review of systems is obtained and is otherwise negative.   PHYSICAL EXAM:  Wt Readings from Last 3 Encounters:  09/23/22 205 lb (93 kg)  09/22/22 205 lb (93 kg)  05/27/12 220 lb (99.8 kg)   Temp Readings from Last 3 Encounters:  09/23/22 98.6 F (37 C) (Oral)  09/22/22 (!) 97.3 F (36.3 C) (Temporal)  05/27/12 98.7 F (37.1  C) (Oral)   BP Readings from Last 3 Encounters:  09/23/22 (!) 143/83  09/22/22 136/74  05/27/12 (!) 144/86   Pulse Readings from Last 3 Encounters:  09/23/22 62  09/22/22 64  05/27/12 69    /10  In general this is a well appearing African-American man in no acute distress. He's alert and oriented x4 and appropriate throughout the examination. Cardiopulmonary assessment is negative for acute distress and he exhibits normal effort.    KPS = 100  100 - Normal; no complaints; no evidence of disease. 90   - Able to carry on normal activity; minor signs or symptoms of disease. 80   - Normal activity with effort; some signs or symptoms of disease. 17   - Cares for self; unable to carry on normal activity or to do active work. 60   - Requires occasional assistance, but is able to care for most of his personal needs. 50   - Requires considerable assistance and frequent medical care. 40   - Disabled; requires special care and assistance. 30   - Severely disabled; hospital admission is indicated although death not imminent. 20   - Very sick; hospital admission necessary; active supportive treatment necessary. 10   - Moribund; fatal processes progressing rapidly. 0     - Dead  Karnofsky DA, Abelmann WH, Craver LS and Burchenal [facility] [phone]) The use of the nitrogen mustards in the palliative treatment of carcinoma: with particular reference to bronchogenic carcinoma Cancer 1 634-56   LABORATORY DATA:  Lab Results  Component Value Date   WBC 5.5 09/23/2022   HGB 14.3 09/23/2022   HCT 41.7 09/23/2022   MCV 86.5 09/23/2022   PLT 193 09/23/2022   Lab Results  Component Value Date   NA 140 09/23/2022   K 3.9 09/23/2022   CL 105 09/23/2022   CO2 27 09/23/2022   Lab Results  Component Value Date   ALT 10 09/23/2022   AST 13 (L) 09/23/2022   ALKPHOS 46 09/23/2022   BILITOT 0.7 09/23/2022     RADIOGRAPHY: CT ABDOMEN PELVIS W CONTRAST  Result Date: 09/23/2022 CLINICAL DATA:  Severe  abdominal pain since this morning EXAM: CT ABDOMEN AND PELVIS WITH CONTRAST TECHNIQUE: Multidetector CT imaging of the abdomen and pelvis was performed using the standard protocol following bolus administration of intravenous contrast. RADIATION DOSE REDUCTION: This exam was performed according to the departmental dose-optimization program which includes automated exposure control, adjustment of the mA and/or kV according to patient size and/or use of iterative reconstruction technique. CONTRAST:  OMNIPAQUE IOHEXOL 300 MG/ML  SOLN COMPARISON:  None Available. FINDINGS: Lower chest: No acute abnormality. Bibasilar scarring or atelectasis. Coronary artery calcifications. Hepatobiliary: No solid liver abnormality is seen. Gallstones with vacuum phenomenon. No gallbladder wall thickening, or biliary dilatation. Pancreas: Unremarkable. No pancreatic ductal dilatation or surrounding inflammatory changes. Spleen: Normal in size without significant abnormality. Adrenals/Urinary Tract: Adrenal glands are unremarkable. Small nonobstructive calculi of the superior pole of the right kidney.  No left-sided calculi, ureteral calculi, or hydronephrosis. Numerous bilateral benign renal cortical cysts and subcentimeter lesions too small to characterize although almost certainly additional cysts, requiring no specific further follow-up or characterization. Mildly distended urinary bladder. Stomach/Bowel: Stomach is within normal limits. Appendix appears normal. Mild, long segment colon wall thickening, mucosal hyperenhancement, and adjacent vascular combing involving the hepatic flexure through the descending colon (series 2, image 37, series 5, image 82). Vascular/Lymphatic: Aortic atherosclerosis. No enlarged abdominal or pelvic lymph nodes. Reproductive: Prostatomegaly. Mild fat stranding about the prostate (series 2, image 79). Other: Fat containing umbilical hernia.  No ascites. Musculoskeletal: No acute or significant  osseous findings. IMPRESSION: 1. Mild, nonspecific infectious, inflammatory, or ischemic colitis involving the hepatic flexure through the descending colon. 2. Prostatomegaly with mild fat stranding about the prostate, suggesting prostatitis. Mildly distended urinary bladder. Correlate for urinary retention. 3. Cholelithiasis without evidence of acute cholecystitis. 4. Nonobstructive right nephrolithiasis. 5. Coronary artery disease. Aortic Atherosclerosis (ICD10-I70.0). Electronically Signed   By: Jearld Lesch M.D.   On: 09/23/2022 12:44   US Abdomen Limited RUQ (LIVER/GB)  Result Date: 09/23/2022 CLINICAL DATA:  Right upper pain EXAM: ULTRASOUND ABDOMEN LIMITED RIGHT UPPER QUADRANT COMPARISON:  None Available. FINDINGS: Gallbladder: Multiple calcified gallstones are noted measuring up to 2.2 cm as well as layering sludge. There is no gallbladder wall thickening or pericholecystic fluid. Common bile duct: Diameter: 5 mm Liver: No focal lesion identified. Within normal limits in parenchymal echogenicity. Portal vein is patent on color Doppler imaging with normal direction of blood flow towards the liver. Other: None. IMPRESSION: Cholelithiasis without evidence of acute cholecystitis. Electronically Signed   By: Lesia Hausen M.D.   On: 09/23/2022 11:33      IMPRESSION/PLAN: 52 y.o. gentleman with Stage T1c adenocarcinoma of the prostate with a Gleason score of 3+4 and a PSA of 6.79.    We reviewed the patient's workup and the nature of prostate cancer in this setting. The patient's T stage, Gleason's score, and PSA put him into the favorable intermediate risk group. Accordingly, he is eligible for a variety of potential treatment options including brachytherapy, 5.5 weeks of external radiation, or prostatectomy. We again discussed the available radiation techniques, and focused on the details and logistics of delivery. The patient is not an ideal candidate for brachytherapy with a prostate volume of 74 cc  and moderate LUTS.  Therefore, we reviewed and outlined the risks, benefits, short and long-term effects associated with daily external beam radiotherapy and compared and contrasted these with prostatectomy for clarification. We reviewed the role of SpaceOAR gel in reducing the rectal toxicity associated with radiotherapy.   He is still trying to decide between external beam radiation or prostatectomy.  We strongly encourage prostatectomy given his young age and discussed some of the potential advantages of surgery including surgical staging, the availability of salvage radiotherapy to the prostatic fossa, and the confidence associated with immediate biochemical response. We also reviewed some of the potential proven indications for postoperative radiotherapy including positive margins, extracapsular extension, and seminal vesicle involvement.  Additionally, we talked about some of the other potential findings leading to a recommendation for radiotherapy including a non-zero postoperative PSA and positive lymph nodes. He appears to have a good understanding of his disease and our treatment recommendations which are of curative intent.  He was encouraged to ask questions that were answered to his stated satisfaction.  At the end of the conversation the patient remains undecided and plans to take some additional time to  consider his options but feels like he has a better understanding of the different treatment options and what each entails.  He has our contact information so that he can let us know once he reaches a final decision and we will proceed with treatment planning accordingly at that time.  I enjoyed meeting with him again today and look forward to continuing to participate in his care should he ultimately elect to proceed with 5.5 weeks of daily external beam radiation.  He knows that he is welcome to call at anytime with any further questions or concerns related to the treatment options discussed  today.  I personally spent 30 minutes in this encounter including chart review, reviewing radiological studies, meeting face-to-face with the patient, entering orders and completing documentation.     Marguarite Arbour, MMS, PA-C [place]  [facility] Radiation Oncology Physician Assistant Direct Dial: [phone]  Fax: [phone]

## 2022-10-20 NOTE — Progress Notes (Signed)
Patient was presented to the [facility] on 8/27 for his stage T1c adenocarcinoma of the prostate with a Gleason's score of 3+4 and a PSA of 6.79 has decided to move forward with surgery.   MD's notified, plan of care in progress.

## 2022-11-03 NOTE — Progress Notes (Signed)
RN received paperwork on patients' behalf to forward to [employer] regarding cancer insurance.  Forms successfully faxed to [employer]. Patient aware.

## 2022-11-04 DIAGNOSIS — M6281 Muscle weakness (generalized): Secondary | ICD-10-CM | POA: Diagnosis not present

## 2022-11-04 DIAGNOSIS — C61 Malignant neoplasm of prostate: Secondary | ICD-10-CM | POA: Diagnosis not present

## 2022-11-17 ENCOUNTER — Other Ambulatory Visit: Payer: Self-pay | Admitting: Urology

## 2022-11-19 NOTE — Progress Notes (Signed)
Patient is scheduled for pre-op appointment with Dr. Laverle Patter on 02/02/23 and for robotic prostatectomy on 02/15/23.   RN spoke with patient, answered all questions.  No additional needs at this time.

## 2022-12-22 ENCOUNTER — Observation Stay (HOSPITAL_BASED_OUTPATIENT_CLINIC_OR_DEPARTMENT_OTHER)
Admission: EM | Admit: 2022-12-22 | Discharge: 2022-12-23 | Disposition: A | Discharge status: Home / Self Care | Payer: BC Managed Care – PPO | Attending: General Surgery | Admitting: General Surgery

## 2022-12-22 ENCOUNTER — Encounter (HOSPITAL_BASED_OUTPATIENT_CLINIC_OR_DEPARTMENT_OTHER): Payer: Self-pay | Admitting: Emergency Medicine

## 2022-12-22 ENCOUNTER — Inpatient Hospital Stay (HOSPITAL_COMMUNITY): Payer: BC Managed Care – PPO

## 2022-12-22 ENCOUNTER — Emergency Department (HOSPITAL_BASED_OUTPATIENT_CLINIC_OR_DEPARTMENT_OTHER): Payer: BC Managed Care – PPO

## 2022-12-22 ENCOUNTER — Other Ambulatory Visit: Payer: Self-pay

## 2022-12-22 DIAGNOSIS — N200 Calculus of kidney: Secondary | ICD-10-CM | POA: Diagnosis not present

## 2022-12-22 DIAGNOSIS — K8020 Calculus of gallbladder without cholecystitis without obstruction: Secondary | ICD-10-CM | POA: Diagnosis not present

## 2022-12-22 DIAGNOSIS — K810 Acute cholecystitis: Principal | ICD-10-CM | POA: Diagnosis present

## 2022-12-22 DIAGNOSIS — K529 Noninfective gastroenteritis and colitis, unspecified: Secondary | ICD-10-CM | POA: Diagnosis not present

## 2022-12-22 DIAGNOSIS — K8000 Calculus of gallbladder with acute cholecystitis without obstruction: Principal | ICD-10-CM | POA: Insufficient documentation

## 2022-12-22 DIAGNOSIS — R1013 Epigastric pain: Secondary | ICD-10-CM | POA: Diagnosis not present

## 2022-12-22 DIAGNOSIS — R109 Unspecified abdominal pain: Secondary | ICD-10-CM | POA: Diagnosis not present

## 2022-12-22 LAB — CBC WITH DIFFERENTIAL/PLATELET
Abs Immature Granulocytes: 0.02 10*3/uL (ref 0.00–0.07)
Basophils Absolute: 0 10*3/uL (ref 0.0–0.1)
Basophils Relative: 0 %
Eosinophils Absolute: 0 10*3/uL (ref 0.0–0.5)
Eosinophils Relative: 0 %
HCT: 43 % (ref 39.0–52.0)
Hemoglobin: 14.8 g/dL (ref 13.0–17.0)
Immature Granulocytes: 0 %
Lymphocytes Relative: 18 %
Lymphs Abs: 1.3 10*3/uL (ref 0.7–4.0)
MCH: 29.4 pg (ref 26.0–34.0)
MCHC: 34.4 g/dL (ref 30.0–36.0)
MCV: 85.5 fL (ref 80.0–100.0)
Monocytes Absolute: 0.4 10*3/uL (ref 0.1–1.0)
Monocytes Relative: 6 %
Neutro Abs: 5.4 10*3/uL (ref 1.7–7.7)
Neutrophils Relative %: 76 %
Platelets: 201 10*3/uL (ref 150–400)
RBC: 5.03 MIL/uL (ref 4.22–5.81)
RDW: 14.1 % (ref 11.5–15.5)
WBC: 7.2 10*3/uL (ref 4.0–10.5)
nRBC: 0 % (ref 0.0–0.2)

## 2022-12-22 LAB — URINALYSIS, ROUTINE W REFLEX MICROSCOPIC
Bacteria, UA: NONE SEEN
Bilirubin Urine: NEGATIVE
Glucose, UA: NEGATIVE mg/dL
Hgb urine dipstick: NEGATIVE
Ketones, ur: NEGATIVE mg/dL
Nitrite: NEGATIVE
Protein, ur: NEGATIVE mg/dL
Specific Gravity, Urine: 1.011 (ref 1.005–1.030)
pH: 6.5 (ref 5.0–8.0)

## 2022-12-22 LAB — COMPREHENSIVE METABOLIC PANEL
ALT: 11 U/L (ref 0–44)
AST: 12 U/L — ABNORMAL LOW (ref 15–41)
Albumin: 4.1 g/dL (ref 3.5–5.0)
Alkaline Phosphatase: 57 U/L (ref 38–126)
Anion gap: 7 (ref 5–15)
BUN: 11 mg/dL (ref 6–20)
CO2: 29 mmol/L (ref 22–32)
Calcium: 9.9 mg/dL (ref 8.9–10.3)
Chloride: 103 mmol/L (ref 98–111)
Creatinine, Ser: 1.15 mg/dL (ref 0.61–1.24)
GFR, Estimated: >60 mL/min (ref >60)
Glucose, Bld: 120 mg/dL — ABNORMAL HIGH (ref 70–99)
Potassium: 3.9 mmol/L (ref 3.5–5.1)
Sodium: 139 mmol/L (ref 135–145)
Total Bilirubin: 1 mg/dL (ref <1.2)
Total Protein: 8.3 g/dL — ABNORMAL HIGH (ref 6.5–8.1)

## 2022-12-22 LAB — LIPASE, BLOOD: Lipase: 17 U/L (ref 11–51)

## 2022-12-22 MED ORDER — HYDROMORPHONE HCL 1 MG/ML IJ SOLN
1.0000 mg | Freq: Once | INTRAMUSCULAR | Status: AC
  Administered 2022-12-22: 1 mg via INTRAVENOUS
  Filled 2022-12-22: qty 1

## 2022-12-22 MED ORDER — IOHEXOL 300 MG/ML  SOLN
100.0000 mL | Freq: Once | INTRAMUSCULAR | Status: AC | PRN
  Administered 2022-12-22: 85 mL via INTRAVENOUS

## 2022-12-22 MED ORDER — SODIUM CHLORIDE 0.9 % IV SOLN
2.0000 g | Freq: Once | INTRAVENOUS | Status: AC
  Administered 2022-12-22: 2 g via INTRAVENOUS
  Filled 2022-12-22: qty 20

## 2022-12-22 MED ORDER — LACTATED RINGERS IV BOLUS
1000.0000 mL | Freq: Once | INTRAVENOUS | Status: AC
  Administered 2022-12-22: 1000 mL via INTRAVENOUS

## 2022-12-22 MED ORDER — MORPHINE SULFATE (PF) 4 MG/ML IV SOLN
4.0000 mg | Freq: Once | INTRAVENOUS | Status: AC
  Administered 2022-12-22: 4 mg via INTRAVENOUS
  Filled 2022-12-22: qty 1

## 2022-12-22 MED ORDER — FENTANYL CITRATE PF 50 MCG/ML IJ SOSY
50.0000 ug | PREFILLED_SYRINGE | Freq: Once | INTRAMUSCULAR | Status: AC
  Administered 2022-12-22: 50 ug via INTRAVENOUS
  Filled 2022-12-22: qty 1

## 2022-12-22 MED ORDER — IOHEXOL 9 MG/ML PO SOLN
500.0000 mL | ORAL | Status: AC
  Administered 2022-12-22 (×2): 500 mL via ORAL

## 2022-12-22 MED ORDER — ONDANSETRON HCL 4 MG/2ML IJ SOLN
4.0000 mg | Freq: Once | INTRAMUSCULAR | Status: AC
  Administered 2022-12-22: 4 mg via INTRAVENOUS
  Filled 2022-12-22: qty 2

## 2022-12-22 NOTE — ED Notes (Signed)
Patient transported to CT 

## 2022-12-22 NOTE — ED Triage Notes (Signed)
C/o ABD pain starting this morning. Hx of colitis. Patient reports feels same as previous flare ups.

## 2022-12-22 NOTE — ED Provider Notes (Signed)
[place] EMERGENCY DEPARTMENT AT [facility] Provider Note   CSN: [id number] Arrival date & time: 12/22/22  1721     History  Chief Complaint  Patient presents with   Abdominal Pain    Joshua Bautista is a 52 y.o. male.   Abdominal Pain    Patient has a history of prostate cancer.  Patient presents ED with complaints of abdominal pain.  Patient states this is his colitis.    He is having diffuse abdominal pain.  No vomiting or diarrhea.  No dysuria.  Patient was hoping to get a course of antibiotics for this.  Patient has had a similar episode in the past.  Back in August of this year he was seen in the emergency room for abdominal pain.  Patient CT scan showed mild nonspecific infectious inflammatory or ischemic colitis findings.  Patient also had prostatic megaly with mild fat stranding suggesting prostatitis.  He had mild distended urinary bladder.  He also had cholelithiasis without evidence of cholecystitis.  Home Medications Prior to Admission medications   Medication Sig Start Date End Date Taking? Authorizing Provider  ibuprofen (ADVIL) 600 MG tablet Take 1 tablet (600 mg total) by mouth every 6 (six) hours as needed. 09/23/22   Peter Garter, PA  oxyCODONE (ROXICODONE) 5 MG immediate release tablet Take 1 tablet (5 mg total) by mouth every 6 (six) hours as needed for severe pain. 09/23/22   Peter Garter, PA      Allergies    Patient has no known allergies.    Review of Systems   Review of Systems  Gastrointestinal:  Positive for abdominal pain.    Physical Exam Updated Vital Signs BP (!) 149/89   Pulse 62   Temp 97.9 F (36.6 C)   Resp 16   Ht 1.829 m (6')   Wt 93 kg   SpO2 94%   BMI 27.80 kg/m  Physical Exam Vitals and nursing note reviewed.  Constitutional:      General: He is not in acute distress.    Appearance: He is well-developed.  HENT:     Head: Normocephalic and atraumatic.     Right Ear: External ear normal.     Left  Ear: External ear normal.  Eyes:     General: No scleral icterus.       Right eye: No discharge.        Left eye: No discharge.     Conjunctiva/sclera: Conjunctivae normal.  Neck:     Trachea: No tracheal deviation.  Cardiovascular:     Rate and Rhythm: Normal rate and regular rhythm.  Pulmonary:     Effort: Pulmonary effort is normal. No respiratory distress.     Breath sounds: Normal breath sounds. No stridor. No wheezing or rales.  Abdominal:     General: Bowel sounds are normal. There is no distension.     Palpations: Abdomen is soft.     Tenderness: There is generalized abdominal tenderness. There is no guarding or rebound.     Hernia: No hernia is present.  Musculoskeletal:        General: No tenderness or deformity.     Cervical back: Neck supple.  Skin:    General: Skin is warm and dry.     Findings: No rash.  Neurological:     General: No focal deficit present.     Mental Status: He is alert.     Cranial Nerves: No cranial nerve deficit, dysarthria or facial asymmetry.  Sensory: No sensory deficit.     Motor: No abnormal muscle tone or seizure activity.     Coordination: Coordination normal.  Psychiatric:        Mood and Affect: Mood normal.     ED Results / Procedures / Treatments   Labs (all labs ordered are listed, but only abnormal results are displayed) Labs Reviewed  COMPREHENSIVE METABOLIC PANEL - Abnormal; Notable for the following components:      Result Value   Glucose, Bld 120 (*)    Total Protein 8.3 (*)    AST 12 (*)    All other components within normal limits  URINALYSIS, ROUTINE W REFLEX MICROSCOPIC - Abnormal; Notable for the following components:   Color, Urine COLORLESS (*)    Leukocytes,Ua SMALL (*)    All other components within normal limits  LIPASE, BLOOD  CBC WITH DIFFERENTIAL/PLATELET    EKG None  Radiology CT ABDOMEN PELVIS W CONTRAST  Result Date: 12/22/2022 CLINICAL DATA:  Abdominal pain.  History of colitis. EXAM:  CT ABDOMEN AND PELVIS WITH CONTRAST TECHNIQUE: Multidetector CT imaging of the abdomen and pelvis was performed using the standard protocol following bolus administration of intravenous contrast. RADIATION DOSE REDUCTION: This exam was performed according to the departmental dose-optimization program which includes automated exposure control, adjustment of the mA and/or kV according to patient size and/or use of iterative reconstruction technique. CONTRAST:  85mL OMNIPAQUE IOHEXOL 300 MG/ML  SOLN COMPARISON:  CT abdomen pelvis dated 09/23/2022. FINDINGS: Lower chest: Minimal bibasilar atelectasis. The visualized lung bases are otherwise clear. No intra-abdominal free air or free fluid. Hepatobiliary: The liver is unremarkable. Multiple gallstones with gas fissuring. There is haziness of the gallbladder wall and pericholecystic fluid concerning for acute cholecystitis. Clinical correlation recommended. Pancreas: Unremarkable. No pancreatic ductal dilatation or surrounding inflammatory changes. Spleen: Normal in size without focal abnormality. Adrenals/Urinary Tract: The adrenal glands unremarkable right renal cysts and additional bilateral renal hypodense lesions which are too small to characterize. There is a 3 mm nonobstructing right renal upper pole calculus. Additional punctate nonobstructing stone in the upper pole of the right kidney. There is no hydronephrosis on either side. There is symmetric enhancement and excretion of contrast by both kidneys. The visualized ureters and urinary bladder appear unremarkable. Stomach/Bowel: There is no bowel obstruction or active inflammation. The appendix is normal. Vascular/Lymphatic: Mild aortoiliac atherosclerotic disease. The IVC is unremarkable. No portal venous gas. There is no adenopathy. Reproductive: Mild haziness of the prostate and seminal vesicles. Clinical correlation recommended to exclude prostatitis. Other: There is mild diffuse haziness of the omentum  similar to prior CT of indeterminate etiology. No fluid collection. Musculoskeletal: Degenerative changes of the lower lumbar spine. No acute osseous pathology. IMPRESSION: 1. Cholelithiasis with findings concerning for acute cholecystitis. Further evaluation with ultrasound recommended. 2. No bowel obstruction. Normal appendix. 3. Nonobstructing right renal calculi. 4. Mild diffuse omental haziness similar to prior CT, nonspecific. 5.  Aortic Atherosclerosis (ICD10-I70.0). Electronically Signed   By: Elgie Collard M.D.   On: 12/22/2022 21:27    Procedures Procedures    Medications Ordered in ED Medications  cefTRIAXone (ROCEPHIN) 2 g in sodium chloride 0.9 % 100 mL IVPB (has no administration in time range)  lactated ringers bolus 1,000 mL (0 mLs Intravenous Stopped 12/22/22 1934)  morphine (PF) 4 MG/ML injection 4 mg (4 mg Intravenous Given 12/22/22 1813)  ondansetron (ZOFRAN) injection 4 mg (4 mg Intravenous Given 12/22/22 1813)  iohexol (OMNIPAQUE) 9 MG/ML oral solution 500 mL (500 mLs  Oral Contrast Given 12/22/22 2028)  fentaNYL (SUBLIMAZE) injection 50 mcg (50 mcg Intravenous Given 12/22/22 1915)  iohexol (OMNIPAQUE) 300 MG/ML solution 100 mL (85 mLs Intravenous Contrast Given 12/22/22 2006)  HYDROmorphone (DILAUDID) injection 1 mg (1 mg Intravenous Given 12/22/22 2107)    ED Course/ Medical Decision Making/ A&P Clinical Course as of 12/22/22 2150  Tue Dec 22, 2022  1850 CBC normal.  Metabolic panel normal.  Lipase normal. [JK]  1910 CBC metabolic panel normal.  Lipase normal urinalysis normal. [JK]  2130 CT scan shows cholelithiasis with findings concerning for acute cholecystitis.  No evidence of bowel obstruction colitis or appendicitis.  Nonobstructing renal calculi noted [JK]  2146 Case discussed with Dr. Sheliah Hatch.  Ultrasound is not available at this facility at this time.  He recommends admission to hospital on his service.  I have ordered an ultrasound the patient will have  that done when he gets to [facility].  IV antibiotics have been ordered [JK]    Clinical Course User Index [JK] Linwood Dibbles, MD                                 Medical Decision Making Differential diagnosis includes but not limited to pancreatitis hepatitis cholecystitis diverticulitis colitis ureterolithiasis  Problems Addressed: Acute cholecystitis: acute illness or injury that poses a threat to life or bodily functions  Amount and/or Complexity of Data Reviewed Labs: ordered. Decision-making details documented in ED Course. Radiology: ordered and independent interpretation performed. Discussion of management or test interpretation with external provider(s): Dr Sheliah Hatch  Risk Prescription drug management. Parenteral controlled substances. Decision regarding hospitalization.   Patient presented to the ED with complaints of recurrent abdominal pain.  Patient having significant tenderness on exam.  He required multiple doses of IV antibiotics.  Patient CT scan does not show evidence of colitis however does show evidence of acute cholecystitis.  Patient's previous CT scan did show gallstones and I wonder if some of his symptoms the previous time was related to biliary colic.  I have ordered IV antibiotics.  Ultrasound is not available at this facility's.  I spoke with Dr. Sheliah Hatch and patient will be transferred to [facility] for admission.  Will plan on ultrasound when he arrives there.       Final Clinical Impression(s) / ED Diagnoses Final diagnoses:  Acute cholecystitis    Rx / DC Orders ED Discharge Orders     None         Linwood Dibbles, MD 12/22/22 2150

## 2022-12-23 ENCOUNTER — Encounter (HOSPITAL_COMMUNITY): Payer: Self-pay

## 2022-12-23 ENCOUNTER — Encounter (HOSPITAL_COMMUNITY)
Admission: EM | Disposition: A | Discharge status: Home / Self Care | Payer: Self-pay | Source: Home / Self Care | Attending: Emergency Medicine

## 2022-12-23 ENCOUNTER — Observation Stay (HOSPITAL_COMMUNITY): Payer: BC Managed Care – PPO | Admitting: Anesthesiology

## 2022-12-23 DIAGNOSIS — K810 Acute cholecystitis: Secondary | ICD-10-CM | POA: Diagnosis not present

## 2022-12-23 DIAGNOSIS — K8000 Calculus of gallbladder with acute cholecystitis without obstruction: Secondary | ICD-10-CM | POA: Diagnosis present

## 2022-12-23 DIAGNOSIS — K8020 Calculus of gallbladder without cholecystitis without obstruction: Secondary | ICD-10-CM | POA: Diagnosis not present

## 2022-12-23 HISTORY — PX: CHOLECYSTECTOMY: SHX55

## 2022-12-23 LAB — CBC
HCT: 39.9 % (ref 39.0–52.0)
Hemoglobin: 13.8 g/dL (ref 13.0–17.0)
MCH: 28.8 pg (ref 26.0–34.0)
MCHC: 34.6 g/dL (ref 30.0–36.0)
MCV: 83.3 fL (ref 80.0–100.0)
Platelets: 195 10*3/uL (ref 150–400)
RBC: 4.79 MIL/uL (ref 4.22–5.81)
RDW: 13.8 % (ref 11.5–15.5)
WBC: 9 10*3/uL (ref 4.0–10.5)
nRBC: 0 % (ref 0.0–0.2)

## 2022-12-23 LAB — CREATININE, SERUM
Creatinine, Ser: 1.13 mg/dL (ref 0.61–1.24)
GFR, Estimated: >60 mL/min (ref >60)

## 2022-12-23 LAB — HIV ANTIBODY (ROUTINE TESTING W REFLEX): HIV Screen 4th Generation wRfx: NONREACTIVE

## 2022-12-23 SURGERY — LAPAROSCOPIC CHOLECYSTECTOMY
Anesthesia: General | Site: Abdomen

## 2022-12-23 MED ORDER — CHLORHEXIDINE GLUCONATE 0.12 % MT SOLN
OROMUCOSAL | Status: AC
  Administered 2022-12-23: 15 mL via OROMUCOSAL
  Filled 2022-12-23: qty 15

## 2022-12-23 MED ORDER — MIDAZOLAM HCL 2 MG/2ML IJ SOLN
INTRAMUSCULAR | Status: DC | PRN
  Administered 2022-12-23: 2 mg via INTRAVENOUS

## 2022-12-23 MED ORDER — ONDANSETRON HCL 4 MG/2ML IJ SOLN
INTRAMUSCULAR | Status: DC | PRN
  Administered 2022-12-23: 4 mg via INTRAVENOUS

## 2022-12-23 MED ORDER — LACTATED RINGERS IV SOLN: INTRAVENOUS | Status: DC

## 2022-12-23 MED ORDER — METOPROLOL TARTRATE 5 MG/5ML IV SOLN: 5.0000 mg | Freq: Four times a day (QID) | INTRAVENOUS | Status: DC | PRN

## 2022-12-23 MED ORDER — CEFTRIAXONE SODIUM 2 G IJ SOLR: 2.0000 g | INTRAMUSCULAR | Status: DC

## 2022-12-23 MED ORDER — 0.9 % SODIUM CHLORIDE (POUR BTL) OPTIME
TOPICAL | Status: DC | PRN
  Administered 2022-12-23: 1000 mL

## 2022-12-23 MED ORDER — MORPHINE SULFATE (PF) 2 MG/ML IV SOLN
2.0000 mg | INTRAVENOUS | Status: DC | PRN
  Administered 2022-12-23: 2 mg via INTRAVENOUS
  Filled 2022-12-23: qty 1

## 2022-12-23 MED ORDER — HYDROMORPHONE HCL 1 MG/ML IJ SOLN
0.2500 mg | INTRAMUSCULAR | Status: DC | PRN
  Administered 2022-12-23 (×2): 0.5 mg via INTRAVENOUS

## 2022-12-23 MED ORDER — PROPOFOL 10 MG/ML IV BOLUS
INTRAVENOUS | Status: DC | PRN
  Administered 2022-12-23: 150 mg via INTRAVENOUS

## 2022-12-23 MED ORDER — FENTANYL CITRATE (PF) 250 MCG/5ML IJ SOLN
INTRAMUSCULAR | Status: DC | PRN
  Administered 2022-12-23: 100 ug via INTRAVENOUS

## 2022-12-23 MED ORDER — ONDANSETRON 4 MG PO TBDP: 4.0000 mg | ORAL_TABLET | Freq: Four times a day (QID) | ORAL | Status: DC | PRN

## 2022-12-23 MED ORDER — ACETAMINOPHEN 500 MG PO TABS
1000.0000 mg | ORAL_TABLET | Freq: Four times a day (QID) | ORAL | Status: DC
  Administered 2022-12-23: 1000 mg via ORAL
  Filled 2022-12-23: qty 2

## 2022-12-23 MED ORDER — SUGAMMADEX SODIUM 200 MG/2ML IV SOLN
INTRAVENOUS | Status: DC | PRN
  Administered 2022-12-23: 200 mg via INTRAVENOUS

## 2022-12-23 MED ORDER — DEXAMETHASONE SODIUM PHOSPHATE 10 MG/ML IJ SOLN
INTRAMUSCULAR | Status: DC | PRN
  Administered 2022-12-23: 10 mg via INTRAVENOUS

## 2022-12-23 MED ORDER — EPHEDRINE SULFATE-NACL 50-0.9 MG/10ML-% IV SOSY
PREFILLED_SYRINGE | INTRAVENOUS | Status: DC | PRN
  Administered 2022-12-23: 5 mg via INTRAVENOUS
  Administered 2022-12-23: 10 mg via INTRAVENOUS

## 2022-12-23 MED ORDER — OXYCODONE HCL 5 MG PO TABS: 5.0000 mg | ORAL_TABLET | ORAL | 0 refills | Status: DC | PRN

## 2022-12-23 MED ORDER — BUPIVACAINE-EPINEPHRINE 0.25% -1:200000 IJ SOLN
INTRAMUSCULAR | Status: DC | PRN
  Administered 2022-12-23: 11 mL

## 2022-12-23 MED ORDER — ACETAMINOPHEN 650 MG RE SUPP: 650.0000 mg | Freq: Four times a day (QID) | RECTAL | Status: DC | PRN

## 2022-12-23 MED ORDER — ROCURONIUM BROMIDE 10 MG/ML (PF) SYRINGE
PREFILLED_SYRINGE | INTRAVENOUS | Status: AC
  Filled 2022-12-23: qty 30

## 2022-12-23 MED ORDER — ACETAMINOPHEN 500 MG PO TABS: 1000.0000 mg | ORAL_TABLET | Freq: Four times a day (QID) | ORAL | Status: DC

## 2022-12-23 MED ORDER — LIDOCAINE 2% (20 MG/ML) 5 ML SYRINGE
INTRAMUSCULAR | Status: DC | PRN
  Administered 2022-12-23: 50 mg via INTRAVENOUS

## 2022-12-23 MED ORDER — OXYCODONE HCL 5 MG PO TABS
5.0000 mg | ORAL_TABLET | Freq: Four times a day (QID) | ORAL | Status: DC | PRN
  Administered 2022-12-23: 5 mg via ORAL
  Filled 2022-12-23: qty 1

## 2022-12-23 MED ORDER — ROCURONIUM BROMIDE 10 MG/ML (PF) SYRINGE
PREFILLED_SYRINGE | INTRAVENOUS | Status: DC | PRN
  Administered 2022-12-23: 10 mg via INTRAVENOUS
  Administered 2022-12-23: 60 mg via INTRAVENOUS

## 2022-12-23 MED ORDER — CHLORHEXIDINE GLUCONATE 0.12 % MT SOLN: 15.0000 mL | Freq: Once | OROMUCOSAL | Status: AC

## 2022-12-23 MED ORDER — OXYCODONE HCL 5 MG PO TABS: 5.0000 mg | ORAL_TABLET | ORAL | Status: DC | PRN

## 2022-12-23 MED ORDER — BUPIVACAINE-EPINEPHRINE (PF) 0.25% -1:200000 IJ SOLN
INTRAMUSCULAR | Status: AC
  Filled 2022-12-23: qty 30

## 2022-12-23 MED ORDER — ONDANSETRON HCL 4 MG/2ML IJ SOLN
INTRAMUSCULAR | Status: AC
  Filled 2022-12-23: qty 2

## 2022-12-23 MED ORDER — ACETAMINOPHEN 325 MG PO TABS
650.0000 mg | ORAL_TABLET | Freq: Four times a day (QID) | ORAL | Status: DC | PRN
  Administered 2022-12-23: 650 mg via ORAL
  Filled 2022-12-23: qty 2

## 2022-12-23 MED ORDER — DEXAMETHASONE SODIUM PHOSPHATE 10 MG/ML IJ SOLN
INTRAMUSCULAR | Status: AC
  Filled 2022-12-23: qty 1

## 2022-12-23 MED ORDER — MIDAZOLAM HCL 2 MG/2ML IJ SOLN
INTRAMUSCULAR | Status: AC
Start: 2022-12-23 — End: ?
  Filled 2022-12-23: qty 2

## 2022-12-23 MED ORDER — POLYETHYLENE GLYCOL 3350 17 G PO PACK: 17.0000 g | PACK | Freq: Every day | ORAL | Status: DC | PRN

## 2022-12-23 MED ORDER — INDOCYANINE GREEN 25 MG IV SOLR
2.5000 mg | Freq: Once | INTRAVENOUS | Status: AC
  Administered 2022-12-23: 2.5 mg via INTRAVENOUS
  Filled 2022-12-23: qty 10

## 2022-12-23 MED ORDER — ENOXAPARIN SODIUM 40 MG/0.4ML IJ SOSY: 40.0000 mg | PREFILLED_SYRINGE | INTRAMUSCULAR | Status: DC

## 2022-12-23 MED ORDER — FENTANYL CITRATE (PF) 250 MCG/5ML IJ SOLN
INTRAMUSCULAR | Status: AC
  Filled 2022-12-23: qty 5

## 2022-12-23 MED ORDER — ONDANSETRON HCL 4 MG/2ML IJ SOLN: 4.0000 mg | Freq: Four times a day (QID) | INTRAMUSCULAR | Status: DC | PRN

## 2022-12-23 MED ORDER — PHENYLEPHRINE 80 MCG/ML (10ML) SYRINGE FOR IV PUSH (FOR BLOOD PRESSURE SUPPORT)
PREFILLED_SYRINGE | INTRAVENOUS | Status: DC | PRN
  Administered 2022-12-23 (×4): 80 ug via INTRAVENOUS

## 2022-12-23 MED ORDER — ORAL CARE MOUTH RINSE: 15.0000 mL | Freq: Once | OROMUCOSAL | Status: AC

## 2022-12-23 MED ORDER — HYDROMORPHONE HCL 1 MG/ML IJ SOLN
INTRAMUSCULAR | Status: AC
  Filled 2022-12-23: qty 1

## 2022-12-23 MED ORDER — LIDOCAINE 2% (20 MG/ML) 5 ML SYRINGE
INTRAMUSCULAR | Status: AC
  Filled 2022-12-23: qty 10

## 2022-12-23 SURGICAL SUPPLY — 36 items
BAG COUNTER SPONGE SURGICOUNT (BAG) ×1 IMPLANT
CANISTER SUCT 3000ML PPV (MISCELLANEOUS) ×1 IMPLANT
CHLORAPREP W/TINT 26 (MISCELLANEOUS) ×1 IMPLANT
CLIP LIGATING HEMO O LOK GREEN (MISCELLANEOUS) ×1 IMPLANT
COVER SURGICAL LIGHT HANDLE (MISCELLANEOUS) ×1 IMPLANT
COVER TRANSDUCER ULTRASND (DRAPES) ×1 IMPLANT
DERMABOND ADVANCED .7 DNX12 (GAUZE/BANDAGES/DRESSINGS) ×1 IMPLANT
ELECT REM PT RETURN 9FT ADLT (ELECTROSURGICAL) ×1
ELECTRODE REM PT RTRN 9FT ADLT (ELECTROSURGICAL) ×1 IMPLANT
GLOVE BIO SURGEON STRL SZ7.5 (GLOVE) ×2 IMPLANT
GOWN STRL REUS W/ TWL LRG LVL3 (GOWN DISPOSABLE) ×2 IMPLANT
GOWN STRL REUS W/ TWL XL LVL3 (GOWN DISPOSABLE) ×1 IMPLANT
GRASPER SUT TROCAR 14GX15 (MISCELLANEOUS) ×1 IMPLANT
IRRIG SUCT STRYKERFLOW 2 WTIP (MISCELLANEOUS) ×1
IRRIGATION SUCT STRKRFLW 2 WTP (MISCELLANEOUS) ×1 IMPLANT
KIT BASIN OR (CUSTOM PROCEDURE TRAY) ×1 IMPLANT
KIT IMAGING PINPOINTPAQ (MISCELLANEOUS) IMPLANT
KIT TURNOVER KIT B (KITS) ×1 IMPLANT
NDL INSUFFLATION 14GA 120MM (NEEDLE) ×1 IMPLANT
NEEDLE INSUFFLATION 14GA 120MM (NEEDLE) ×1 IMPLANT
NS IRRIG 1000ML POUR BTL (IV SOLUTION) ×1 IMPLANT
PAD ARMBOARD 7.5X6 YLW CONV (MISCELLANEOUS) ×1 IMPLANT
POUCH LAPAROSCOPIC INSTRUMENT (MISCELLANEOUS) ×1 IMPLANT
POUCH RETRIEVAL ECOSAC 10 (ENDOMECHANICALS) IMPLANT
SCISSORS LAP 5X35 DISP (ENDOMECHANICALS) ×1 IMPLANT
SET TUBE SMOKE EVAC HIGH FLOW (TUBING) ×1 IMPLANT
SLEEVE Z-THREAD 5X100MM (TROCAR) ×1 IMPLANT
SPECIMEN JAR SMALL (MISCELLANEOUS) ×1 IMPLANT
SUT MNCRL AB 4-0 PS2 18 (SUTURE) ×1 IMPLANT
TOWEL GREEN STERILE (TOWEL DISPOSABLE) ×1 IMPLANT
TOWEL GREEN STERILE FF (TOWEL DISPOSABLE) ×1 IMPLANT
TRAY LAPAROSCOPIC MC (CUSTOM PROCEDURE TRAY) ×1 IMPLANT
TROCAR 11X100 Z THREAD (TROCAR) ×1 IMPLANT
TROCAR Z-THREAD OPTICAL 5X100M (TROCAR) ×1 IMPLANT
WARMER LAPAROSCOPE (MISCELLANEOUS) ×1 IMPLANT
WATER STERILE IRR 1000ML POUR (IV SOLUTION) ×1 IMPLANT

## 2022-12-23 NOTE — Anesthesia Procedure Notes (Signed)
Procedure Name: Intubation Date/Time: 12/23/2022 10:25 AM  Performed by: Pincus Large, CRNAPre-anesthesia Checklist: Patient identified, Emergency Drugs available, Suction available and Patient being monitored Patient Re-evaluated:Patient Re-evaluated prior to induction Oxygen Delivery Method: Circle System Utilized Preoxygenation: Pre-oxygenation with 100% oxygen Induction Type: IV induction Ventilation: Mask ventilation without difficulty Laryngoscope Size: Mac and 4 Grade View: Grade II Tube type: Oral Tube size: 7.5 mm Number of attempts: 1 Airway Equipment and Method: Stylet and Oral airway Placement Confirmation: ETT inserted through vocal cords under direct vision, positive ETCO2 and breath sounds checked- equal and bilateral Secured at: 23 cm Tube secured with: Tape Dental Injury: Teeth and Oropharynx as per pre-operative assessment

## 2022-12-23 NOTE — Discharge Instructions (Addendum)

## 2022-12-23 NOTE — Anesthesia Postprocedure Evaluation (Signed)
Anesthesia Post Note  Patient: SHALON SEABERRY  Procedure(s) Performed: LAPAROSCOPIC CHOLECYSTECTOMY (Abdomen) INDOCYANINE GREEN FLUORESCENCE IMAGING (ICG) (Abdomen)     Patient location during evaluation: PACU Anesthesia Type: General Level of consciousness: awake and alert Pain management: pain level controlled Vital Signs Assessment: post-procedure vital signs reviewed and stable Respiratory status: spontaneous breathing, nonlabored ventilation and respiratory function stable Cardiovascular status: blood pressure returned to baseline and stable Postop Assessment: no apparent nausea or vomiting Anesthetic complications: no  No notable events documented.  Last Vitals:  Vitals:   12/23/22 1215 12/23/22 1230  BP:  (!) 148/88  Pulse: 70 69  Resp: 18 19  Temp:    SpO2: 92% 94%    Last Pain:  Vitals:   12/23/22 1200  TempSrc:   PainSc: Asleep                 Collen Vincent,W. EDMOND

## 2022-12-23 NOTE — Plan of Care (Signed)
  Problem: Education: Goal: Knowledge of General Education information will improve Description: Including pain rating scale, medication(s)/side effects and non-pharmacologic comfort measures Outcome: Progressing   Problem: Clinical Measurements: Goal: Respiratory complications will improve Outcome: Progressing Goal: Cardiovascular complication will be avoided Outcome: Progressing   Problem: Activity: Goal: Risk for activity intolerance will decrease Outcome: Progressing   Problem: Nutrition: Goal: Adequate nutrition will be maintained Outcome: Progressing   Problem: Elimination: Goal: Will not experience complications related to bowel motility Outcome: Progressing Goal: Will not experience complications related to urinary retention Outcome: Progressing   Problem: Pain Management: Goal: General experience of comfort will improve Outcome: Progressing   Problem: Safety: Goal: Ability to remain free from injury will improve Outcome: Progressing   Problem: Skin Integrity: Goal: Risk for impaired skin integrity will decrease Outcome: Progressing

## 2022-12-23 NOTE — Anesthesia Preprocedure Evaluation (Addendum)
Anesthesia Evaluation  Patient identified by MRN, date of birth, ID band Patient awake    Reviewed: Allergy & Precautions, H&P , NPO status , Patient's Chart, lab work & pertinent test results  Airway Mallampati: II  TM Distance: >3 FB Neck ROM: Full    Dental no notable dental hx. (+) Teeth Intact, Dental Advisory Given   Pulmonary neg pulmonary ROS   Pulmonary exam normal breath sounds clear to auscultation       Cardiovascular negative cardio ROS  Rhythm:Regular Rate:Normal     Neuro/Psych negative neurological ROS  negative psych ROS   GI/Hepatic negative GI ROS, Neg liver ROS,,,  Endo/Other  negative endocrine ROS    Renal/GU negative Renal ROS  negative genitourinary   Musculoskeletal   Abdominal   Peds  Hematology negative hematology ROS (+)   Anesthesia Other Findings   Reproductive/Obstetrics negative OB ROS                             Anesthesia Physical Anesthesia Plan  ASA: 1  Anesthesia Plan: General   Post-op Pain Management: Toradol IV (intra-op)*   Induction: Intravenous  PONV Risk Score and Plan: 3 and Ondansetron, Dexamethasone and Midazolam  Airway Management Planned: Oral ETT  Additional Equipment:   Intra-op Plan:   Post-operative Plan: Extubation in OR  Informed Consent: I have reviewed the patients History and Physical, chart, labs and discussed the procedure including the risks, benefits and alternatives for the proposed anesthesia with the patient or authorized representative who has indicated his/her understanding and acceptance.     Dental advisory given  Plan Discussed with: CRNA  Anesthesia Plan Comments:        Anesthesia Quick Evaluation

## 2022-12-23 NOTE — Progress Notes (Signed)
Patient and family have received discharge instructions to include medications and follow up instructions. Patient verbalizes understanding of instructions.

## 2022-12-23 NOTE — Transfer of Care (Signed)
Immediate Anesthesia Transfer of Care Note  Patient: Joshua Bautista  Procedure(s) Performed: LAPAROSCOPIC CHOLECYSTECTOMY (Abdomen) INDOCYANINE GREEN FLUORESCENCE IMAGING (ICG) (Abdomen)  Patient Location: PACU  Anesthesia Type:General  Level of Consciousness: sedated and drowsy  Airway & Oxygen Therapy: Patient Spontanous Breathing and Patient connected to face mask oxygen  Post-op Assessment: Report given to RN and Post -op Vital signs reviewed and stable  Post vital signs: Reviewed and stable  Last Vitals:  Vitals Value Taken Time  BP 139/84 12/23/22 1141  Temp 97.3   Pulse 74 12/23/22 1143  Resp 14 12/23/22 1143  SpO2 98 % 12/23/22 1143  Vitals shown include unfiled device data.  Last Pain:  Vitals:   12/23/22 1005  TempSrc:   PainSc: 6       Patients Stated Pain Goal: 2 (12/23/22 1005)  Complications: No notable events documented.

## 2022-12-23 NOTE — Progress Notes (Addendum)
Pt arrived to the unit via EMS. Pt alert and oriented x4. Able to walk from stretcher to bed without assistant. VSS. No distress noted. On call MD notified about pt's arrival.

## 2022-12-23 NOTE — H&P (Addendum)
Kerek Varghese Hartin [birth year]  [id number].    Requesting MD: Linwood Dibbles Chief Complaint/Reason for Consult: abdominal pain  HPI:  52 yo male with 1 week of intermittent abdominal pain. Pain is epigastric and moves down along the ribs. He denies nausea or vomiting. His CT showed concern of cholecystitis, he was transferred over for ultrasound  ROS: Review of Systems  Constitutional: Negative.   HENT: Negative.    Eyes: Negative.   Respiratory: Negative.    Cardiovascular: Negative.   Gastrointestinal:  Positive for abdominal pain.  Genitourinary: Negative.   Musculoskeletal: Negative.   Skin: Negative.   Neurological: Negative.   Endo/Heme/Allergies: Negative.   Psychiatric/Behavioral: Negative.      History reviewed. No pertinent family history.  Past Medical History:  Diagnosis Date   Elevated blood pressure    Elevated PSA    Hyperlipidemia     Past Surgical History:  Procedure Laterality Date   PROSTATE BIOPSY      Social History:  reports that he has never smoked. He has never used smokeless tobacco. He reports current alcohol use. He reports that he does not use drugs.  Allergies: No Known Allergies  Medications Prior to Admission  Medication Sig Dispense Refill   ibuprofen (ADVIL) 600 MG tablet Take 1 tablet (600 mg total) by mouth every 6 (six) hours as needed. 30 tablet 0   oxyCODONE (ROXICODONE) 5 MG immediate release tablet Take 1 tablet (5 mg total) by mouth every 6 (six) hours as needed for severe pain. 8 tablet 0    Physical Exam: Blood pressure (!) 142/81, pulse 66, temperature 98 F (36.7 C), temperature source Oral, resp. rate 18, height 6' (1.829 m), weight 94.2 kg, SpO2 98%. Gen: NAD Resp: nonlabored CV: RRR Abd: soft, tender to palpation in the subxiphoid and RUQ Neuro: AOx4  Results for orders placed or performed during the hospital encounter of 12/22/22 (from the past 48 hour(s))  Comprehensive metabolic panel     Status: Abnormal    Collection Time: 12/22/22  6:09 PM  Result Value Ref Range   Sodium 139 135 - 145 mmol/L   Potassium 3.9 3.5 - 5.1 mmol/L   Chloride 103 98 - 111 mmol/L   CO2 29 22 - 32 mmol/L   Glucose, Bld 120 (H) 70 - 99 mg/dL    Comment: Glucose reference range applies only to samples taken after fasting for at least 8 hours.   BUN 11 6 - 20 mg/dL   Creatinine, Ser 6.44 0.61 - 1.24 mg/dL   Calcium 9.9 8.9 - 03.4 mg/dL   Total Protein 8.3 (H) 6.5 - 8.1 g/dL   Albumin 4.1 3.5 - 5.0 g/dL   AST 12 (L) 15 - 41 U/L   ALT 11 0 - 44 U/L   Alkaline Phosphatase 57 38 - 126 U/L   Total Bilirubin 1.0 <1.2 mg/dL   GFR, Estimated >74 >25 mL/min    Comment: (NOTE) Calculated using the CKD-EPI Creatinine Equation (2021)    Anion gap 7 5 - 15    Comment: Performed at [facility], [address] [place], [place] [address]  Lipase, blood     Status: None   Collection Time: 12/22/22  6:09 PM  Result Value Ref Range   Lipase 17 11 - 51 U/L    Comment: Performed at [facility], [address], [place], [place] [address]  CBC with Diff     Status: None   Collection Time: 12/22/22  6:09 PM  Result Value Ref Range   WBC 7.2 4.0 - 10.5 K/uL   RBC 5.03 4.22 - 5.81 MIL/uL   Hemoglobin 14.8 13.0 - 17.0 g/dL   HCT 40.9 81.1 - 91.4 %   MCV 85.5 80.0 - 100.0 fL   MCH 29.4 26.0 - 34.0 pg   MCHC 34.4 30.0 - 36.0 g/dL   RDW 78.2 95.6 - 21.3 %   Platelets 201 150 - 400 K/uL   nRBC 0.0 0.0 - 0.2 %   Neutrophils Relative % 76 %   Neutro Abs 5.4 1.7 - 7.7 K/uL   Lymphocytes Relative 18 %   Lymphs Abs 1.3 0.7 - 4.0 K/uL   Monocytes Relative 6 %   Monocytes Absolute 0.4 0.1 - 1.0 K/uL   Eosinophils Relative 0 %   Eosinophils Absolute 0.0 0.0 - 0.5 K/uL   Basophils Relative 0 %   Basophils Absolute 0.0 0.0 - 0.1 K/uL   Immature Granulocytes 0 %   Abs Immature Granulocytes 0.02 0.00 - 0.07 K/uL    Comment: Performed at [facility], [address],  [place], [place] [address]  Urinalysis, Routine w reflex microscopic -Urine, Clean Catch     Status: Abnormal   Collection Time: 12/22/22  6:09 PM  Result Value Ref Range   Color, Urine COLORLESS (A) YELLOW   APPearance CLEAR CLEAR   Specific Gravity, Urine 1.011 1.005 - 1.030   pH 6.5 5.0 - 8.0   Glucose, UA NEGATIVE NEGATIVE mg/dL   Hgb urine dipstick NEGATIVE NEGATIVE   Bilirubin Urine NEGATIVE NEGATIVE   Ketones, ur NEGATIVE NEGATIVE mg/dL   Protein, ur NEGATIVE NEGATIVE mg/dL   Nitrite NEGATIVE NEGATIVE   Leukocytes,Ua SMALL (A) NEGATIVE   RBC / HPF 0-5 0 - 5 RBC/hpf   WBC, UA 0-5 0 - 5 WBC/hpf   Bacteria, UA NONE SEEN NONE SEEN   Squamous Epithelial / HPF 0-5 0 - 5 /HPF   Mucus PRESENT     Comment: Performed at [facility], [address], [place], [place] [address]  HIV Antibody (routine testing w rflx)     Status: None   Collection Time: 12/23/22 12:41 AM  Result Value Ref Range   HIV Screen 4th Generation wRfx Non Reactive Non Reactive    Comment: Performed at [facility], [address], [place], [place] [address]  CBC     Status: None   Collection Time: 12/23/22 12:41 AM  Result Value Ref Range   WBC 9.0 4.0 - 10.5 K/uL   RBC 4.79 4.22 - 5.81 MIL/uL   Hemoglobin 13.8 13.0 - 17.0 g/dL   HCT 41.3 24.4 - 01.0 %   MCV 83.3 80.0 - 100.0 fL   MCH 28.8 26.0 - 34.0 pg   MCHC 34.6 30.0 - 36.0 g/dL   RDW 27.2 53.6 - 64.4 %   Platelets 195 150 - 400 K/uL   nRBC 0.0 0.0 - 0.2 %    Comment: Performed at [facility], [address], [place], [place] [address]  Creatinine, serum     Status: None   Collection Time: 12/23/22 12:41 AM  Result Value Ref Range   Creatinine, Ser 1.13 0.61 - 1.24 mg/dL   GFR, Estimated >25 >95 mL/min    Comment: (NOTE) Calculated using the CKD-EPI Creatinine Equation (2021) Performed at [facility], [address] [place], [place] [address]    US Abdomen Limited RUQ (LIVER/GB)  Result Date:  12/23/2022 CLINICAL DATA:  Acute cholecystitis EXAM: ULTRASOUND ABDOMEN LIMITED RIGHT UPPER QUADRANT COMPARISON:  CT 12/22/2022 FINDINGS: Gallbladder: Multiple impacted gallstones are seen within the gallbladder neck. The gallbladder, however, is not distended, there is no gallbladder wall thickening, and no pericholecystic fluid is identified. The sonographic Eulah Pont sign is reportedly negative. Common bile duct: Diameter: 3 mm in proximal diameter Liver: No focal lesion identified. Within normal limits in parenchymal echogenicity. Portal vein is patent on color Doppler imaging with normal direction of blood flow towards the liver. Other: None. IMPRESSION: 1. Cholelithiasis without sonographic evidence of acute cholecystitis. Electronically Signed   By: Helyn Numbers M.D.   On: 12/23/2022 01:26   CT ABDOMEN PELVIS W CONTRAST  Result Date: 12/22/2022 CLINICAL DATA:  Abdominal pain.  History of colitis. EXAM: CT ABDOMEN AND PELVIS WITH CONTRAST TECHNIQUE: Multidetector CT imaging of the abdomen and pelvis was performed using the standard protocol following bolus administration of intravenous contrast. RADIATION DOSE REDUCTION: This exam was performed according to the departmental dose-optimization program which includes automated exposure control, adjustment of the mA and/or kV according to patient size and/or use of iterative reconstruction technique. CONTRAST:  85mL OMNIPAQUE IOHEXOL 300 MG/ML  SOLN COMPARISON:  CT abdomen pelvis dated 09/23/2022. FINDINGS: Lower chest: Minimal bibasilar atelectasis. The visualized lung bases are otherwise clear. No intra-abdominal free air or free fluid. Hepatobiliary: The liver is unremarkable. Multiple gallstones with gas fissuring. There is haziness of the gallbladder wall and pericholecystic fluid concerning for acute cholecystitis. Clinical correlation recommended. Pancreas: Unremarkable. No pancreatic ductal dilatation or surrounding inflammatory changes. Spleen:  Normal in size without focal abnormality. Adrenals/Urinary Tract: The adrenal glands unremarkable right renal cysts and additional bilateral renal hypodense lesions which are too small to characterize. There is a 3 mm nonobstructing right renal upper pole calculus. Additional punctate nonobstructing stone in the upper pole of the right kidney. There is no hydronephrosis on either side. There is symmetric enhancement and excretion of contrast by both kidneys. The visualized ureters and urinary bladder appear unremarkable. Stomach/Bowel: There is no bowel obstruction or active inflammation. The appendix is normal. Vascular/Lymphatic: Mild aortoiliac atherosclerotic disease. The IVC is unremarkable. No portal venous gas. There is no adenopathy. Reproductive: Mild haziness of the prostate and seminal vesicles. Clinical correlation recommended to exclude prostatitis. Other: There is mild diffuse haziness of the omentum similar to prior CT of indeterminate etiology. No fluid collection. Musculoskeletal: Degenerative changes of the lower lumbar spine. No acute osseous pathology. IMPRESSION: 1. Cholelithiasis with findings concerning for acute cholecystitis. Further evaluation with ultrasound recommended. 2. No bowel obstruction. Normal appendix. 3. Nonobstructing right renal calculi. 4. Mild diffuse omental haziness similar to prior CT, nonspecific. 5.  Aortic Atherosclerosis (ICD10-I70.0). Electronically Signed   By: Elgie Collard M.D.   On: 12/22/2022 21:27    Assessment/Plan 52 yo male with acute calculous cholecystitis -IV abx -OR today -We discussed the etiology of her pain, we discussed treatment options and recommended surgery. We discussed details of surgery including general anesthesia, laparoscopic approach, identification of cystic duct and common bile duct. Ligation of cystic duct and cystic artery. Possible need for intraoperative cholangiogram or open procedure. Possible risks of common bile duct  injury, liver injury, cystic duct leak, bleeding, infection, post-cholecystectomy syndrome. The patient showed good understanding and all questions were answered   I reviewed last 24 h vitals and pain scores, last 48 h intake and output, last 24 h labs and trends, and last 24 h imaging results.  De Blanch Phallon Haydu  [facility] 12/23/2022, 6:57 AM Please see Amion for pager number during day hours 7:00am-4:30pm or 7:00am -11:30am on weekends

## 2022-12-23 NOTE — Op Note (Signed)
12/23/2022  11:27 AM  PATIENT:  Joshua Bautista  52 y.o. male  PRE-OPERATIVE DIAGNOSIS:  acute cholecystitis  POST-OPERATIVE DIAGNOSIS:  acute cholecystitis  PROCEDURE:  Procedure(s): LAPAROSCOPIC CHOLECYSTECTOMY (N/A) INDOCYANINE GREEN FLUORESCENCE IMAGING (ICG) (N/A)  SURGEON:  Surgeons and Role:    Axel Filler, MD - Primary  ASSISTANTS: Rockwell Germany, RNFA   ANESTHESIA:   local and general  EBL:  minimal   BLOOD ADMINISTERED:none  DRAINS: none   LOCAL MEDICATIONS USED:  BUPIVICAINE   SPECIMEN:  Source of Specimen:  gallbladder  DISPOSITION OF SPECIMEN:  PATHOLOGY  COUNTS:  YES  TOURNIQUET:  * No tourniquets in log *  DICTATION: .Dragon Dictation The patient was taken to the operating and placed in the supine position with bilateral SCDs in place.  The patient was prepped and draped in the usual sterile fashion. A time out was called and all facts were verified. A pneumoperitoneum was obtained via A Veress needle technique to a pressure of 14mm of mercury.  A 5mm trochar was then placed in the right upper quadrant under visualization, and there were no injuries to any abdominal organs. A 11 mm port was then placed in the umbilical region after infiltrating with local anesthesia under direct visualization. A second and third epigastric port and right lower quadrant port placement under direct visualization, respectively.    The gallbladder was identified and retracted, the peritoneum was then sharply dissected from the gallbladder and this dissection was carried down to Calot's triangle. The cystic duct was identified and stripped away circumferentially and seen going into the gallbladder 360, the critical angle was obtained.  2 clips were placed proximally one distally and the cystic duct transected. The cystic artery was identified and 2 clips placed proximally and one distally and transected.  We then proceeded to remove the gallbladder off the hepatic fossa with  Bovie cautery. A retrieval bag was then placed in the abdomen and gallbladder placed in the bag. The hepatic fossa was then reexamined and hemostasis was achieved with Bovie cautery and was excellent at the end of the case.   The subhepatic fossa and perihepatic fossa was then irrigated until the effluent was clear.  The gallbladder and bag were removed from the abdominal cavity. The 11 mm trocar fascia was reapproximated with the Endo Close #1 Vicryl X two.  The pneumoperitoneum was evacuated and all trochars removed under direct visulalization.  The skin was then closed with 4-0 Monocryl and the skin dressed with Dermabond.    The patient was awaken from general anesthesia and taken to the recovery room in stable condition.  PLAN OF CARE: Discharge to home after PACU  PATIENT DISPOSITION:  PACU - hemodynamically stable.   Delay start of Pharmacological VTE agent (>24hrs) due to surgical blood loss or risk of bleeding: not applicable

## 2022-12-23 NOTE — Discharge Summary (Signed)
    Patient ID: Joshua Bautista [id number] 06/12/1970 52 y.o.  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 Pain is epigastric and moves down along the ribs. He denies nausea or vomiting. His CT showed concern of cholecystitis, he was transferred over for ultrasound   Procedures Lap chole with ICG dye, Dr. Derrell Lolling 12/23/22  Hospital Course:  The patient was admitted and underwent a laparoscopic cholecystectomy with ICG.  The patient tolerated the procedure well.  On POD 0, the patient was tolerating a diet, voiding well, mobilizing, and pain was controlled with oral pain medications.  The patient was stable for DC home at this time with appropriate follow up made.  I did not participate in the patient's care.  Info obtained from the chart.  Allergies as of 12/23/2022   No Known Allergies      Medication List     TAKE these medications    acetaminophen 500 MG tablet Commonly known as: TYLENOL Take 2 tablets (1,000 mg total) by mouth every 6 (six) hours.   oxyCODONE 5 MG immediate release tablet Commonly known as: Oxy IR/ROXICODONE Take 1 tablet (5 mg total) by mouth every 4 (four) hours as needed for moderate pain (pain score 4-6) or severe pain (pain score 7-10). What changed:  when to take this reasons to take this          Follow-up Information     Axel Filler, MD. Go on 12/24/2022.   Specialty: General Surgery Why: 12/19 at 10:40 am, Please arrive 30 minutes early to complete check in, and bring photo ID and insurance card. Contact information: [address] [place]  [address] [phone]                 Signed: Barnetta Chapel, [facility] 12/23/2022, 1:56 PM Please see Amion for pager number during day hours 7:00am-4:30pm, 7-11:30am on Weekends

## 2022-12-24 ENCOUNTER — Encounter (HOSPITAL_COMMUNITY): Payer: Self-pay | Admitting: General Surgery

## 2022-12-25 LAB — SURGICAL PATHOLOGY

## 2023-01-05 NOTE — Progress Notes (Signed)
RN provided patient treating provider information for upcoming surgery for FMLA.

## 2023-01-07 DIAGNOSIS — E7800 Pure hypercholesterolemia, unspecified: Secondary | ICD-10-CM | POA: Diagnosis not present

## 2023-01-07 DIAGNOSIS — C61 Malignant neoplasm of prostate: Secondary | ICD-10-CM | POA: Diagnosis not present

## 2023-02-02 DIAGNOSIS — C61 Malignant neoplasm of prostate: Secondary | ICD-10-CM | POA: Diagnosis not present

## 2023-02-03 NOTE — Patient Instructions (Addendum)
 SURGICAL WAITING ROOM VISITATION  Patients having surgery or a procedure may have no more than 2 support people in the waiting area - these visitors may rotate.    Children under the age of 6 must have an adult with them who is not the patient.   If the patient needs to stay at the hospital during part of their recovery, the visitor guidelines for inpatient rooms apply. Pre-op nurse will coordinate an appropriate time for 1 support person to accompany patient in pre-op.  This support person may not rotate.    Please refer to the [facility] website for the visitor guidelines for Inpatients (after your surgery is over and you are in a regular room).       Your procedure is scheduled on: Monday   FRANCINA [id number]   Report to [facility] Main Entrance    Report to admitting at       0900 AM   Call this number if you have problems the morning of surgery [phone]   Do not eat food  OR DRINK LIQUIDS  :After Midnight.           If you have questions, please contact your surgeon's office.   FOLLOW BOWEL PREP AND ANY ADDITIONAL PRE OP INSTRUCTIONS YOU RECEIVED FROM YOUR SURGEON'S OFFICE!!! MAGNESIUM  CITRATE:  Obtain one (1)  bottle (10 oz) of Magnesium  Citrate at your pharmacy. Drink entire bottle at 12:00 noon the day before your surgery/ procedure.  FLEET ENEMA: Obtain one(1) Fleet Enema (sodium phosphate  7-19 gm / 118 ml enema) and use (according to the directions on the box) the night prior to your surgery.   If you have any questions, please contact your Surgeon's office for additional information.              Oral Hygiene is also important to reduce your risk of infection.                                    Remember - BRUSH YOUR TEETH THE MORNING OF SURGERY WITH YOUR REGULAR TOOTHPASTE  DENTURES WILL BE REMOVED PRIOR TO SURGERY PLEASE DO NOT APPLY Poly grip OR ADHESIVES!!!   Do NOT smoke after Midnight   Stop all vitamins and herbal supplements 7 days before  surgery.   Take these medicines the morning of surgery with A SIP OF WATER : NONE                              You may not have any metal on your body including jewelry, and body piercing             Do not wear lotions, powders, cologne, or deodorant              Men may shave face and neck.   Do not bring valuables to the hospital. [facility] IS NOT             RESPONSIBLE   FOR VALUABLES.   Contacts, glasses, dentures or bridgework may not be worn into surgery.   Bring small overnight bag day of surgery.   DO NOT BRING YOUR HOME MEDICATIONS TO THE HOSPITAL. PHARMACY WILL DISPENSE MEDICATIONS LISTED ON YOUR MEDICATION LIST TO YOU DURING YOUR ADMISSION IN THE HOSPITAL!    Patients discharged on the day of surgery will not be allowed to drive home.  Someone NEEDS to stay with you for the first 24 hours after anesthesia.                Please read over the following fact sheets you were given: IF YOU HAVE QUESTIONS ABOUT YOUR PRE-OP INSTRUCTIONS PLEASE CALL [phone]      [facility] - Preparing for Surgery Before surgery, you can play an important role.  Because skin is not sterile, your skin needs to be as free of germs as possible.  You can reduce the number of germs on your skin by washing with CHG (chlorahexidine gluconate) soap before surgery.  CHG is an antiseptic cleaner which kills germs and bonds with the skin to continue killing germs even after washing. Please DO NOT use if you have an allergy to CHG or antibacterial soaps.  If your skin becomes reddened/irritated stop using the CHG and inform your nurse when you arrive at Short Stay. Do not shave (including legs and underarms) for at least 48 hours prior to the first CHG shower.  You may shave your face/neck. Please follow these instructions carefully:  1.  Shower with CHG Soap the night before surgery and the  morning of Surgery.  2.  If you choose to wash your hair, wash your hair first as usual with your  normal   shampoo.  3.  After you shampoo, rinse your hair and body thoroughly to remove the  shampoo.                            4.  Use CHG as you would any other liquid soap.  You can apply chg directly  to the skin and wash                       Gently with a scrungie or clean washcloth.  5.  Apply the CHG Soap to your body ONLY FROM THE NECK DOWN.   Do not use on face/ open                           Wound or open sores. Avoid contact with eyes, ears mouth and genitals (private parts).                       Wash face,  Genitals (private parts) with your normal soap.             6.  Wash thoroughly, paying special attention to the area where your surgery  will be performed.  7.  Thoroughly rinse your body with warm water  from the neck down.  8.  DO NOT shower/wash with your normal soap after using and rinsing off  the CHG Soap.             9.  Pat yourself dry with a clean towel.            10.  Wear clean pajamas.            11.  Place clean sheets on your bed the night of your first shower and do not  sleep with pets.   Day of Surgery :  Do not apply any lotions/deodorants the morning of surgery.  Please wear clean clothes to the hospital.   FAILURE TO FOLLOW THESE INSTRUCTIONS MAY RESULT IN THE CANCELLATION OF YOUR SURGERY PATIENT SIGNATURE_________________________________  NURSE SIGNATURE__________________________________  ________________________________________________________________________

## 2023-02-03 NOTE — Progress Notes (Signed)
 PCP -  Cardiologist -   PPM/ICD -  Device Orders -  Rep Notified -   Chest x-ray -  EKG -  Stress Test -  ECHO -  Cardiac Cath -   Sleep Study -  CPAP -   Fasting Blood Sugar -  Checks Blood Sugar _____ times a day  Blood Thinner Instructions: Aspirin Instructions:  ERAS Protcol - PRE-SURGERY Ensure or G2-   COVID TEST-  COVID vaccine -  Activity-- Anesthesia review:   Patient denies shortness of breath, fever, cough and chest pain at PAT appointment   All instructions explained to the patient, with a verbal understanding of the material. Patient agrees to go over the instructions while at home for a better understanding. Patient also instructed to self quarantine after being tested for COVID-19. The opportunity to ask questions was provided.

## 2023-02-05 NOTE — Progress Notes (Addendum)
 COVID Vaccine received:  [x]  No []  Yes Date of any COVID positive Test in last 90 days: None  PCP - Tanda Bame, MD at [facility]  [phone] Cardiologist - none  Chest x-ray - 2007  2v  Epic EKG -  will do at PST on 02-08-2023 Stress Test -  ECHO -  Cardiac Cath -   PCR screen: []  Ordered & Completed []   No Order but Needs PROFEND     [x]   N/A for this surgery  Surgery Plan:  []  Ambulatory   [x]  Outpatient in bed  []  Admit Anesthesia:    [x]  General  []  Spinal  []   Choice []   MAC  Bowel Prep - []  No  [x]   Yes mag, citrate / fleet enema  Pacemaker / ICD device [x]  No []  Yes   Spinal Cord Stimulator:[x]  No []  Yes       History of Sleep Apnea? [x]  No []  Yes   CPAP used?- [x]  No []  Yes    Does the patient monitor blood sugar?   [x]  N/A   []  No []  Yes  Patient has: [x]  NO Hx DM   []  Pre-DM   []  DM1  []   DM2  Blood Thinner / Instructions:  none Aspirin Instructions:  none  ERAS Protocol Ordered: [x]  No  []  Yes Patient is to be NPO after:  MN  Dental hx: []  Dentures:  [x]  N/A      []  Bridge or Partial:                   []  Loose or Damaged teeth:   Activity level: Patient is able unable to climb a flight of stairs without difficulty; [x]  No CP  [x]  No SOB,   Patient can perform ADLs without assistance.   Anesthesia review:  HTN,  s/p Lap Cholecystectomy 12-23-2022  Patient denies shortness of breath, fever, cough and chest pain at PAT appointment.  Patient verbalized understanding and agreement to the Pre-Surgical Instructions that were given to them at this PAT appointment. Patient was also educated of the need to review these PAT instructions again prior to his surgery.I reviewed the appropriate phone numbers to call if they have any and questions or concerns.

## 2023-02-08 ENCOUNTER — Encounter (HOSPITAL_COMMUNITY)
Admission: RE | Admit: 2023-02-08 | Discharge: 2023-02-08 | Disposition: A | Discharge status: Home / Self Care | Payer: BC Managed Care – PPO | Source: Ambulatory Visit | Attending: Urology

## 2023-02-08 ENCOUNTER — Encounter (HOSPITAL_COMMUNITY): Payer: Self-pay

## 2023-02-08 ENCOUNTER — Other Ambulatory Visit: Payer: Self-pay

## 2023-02-08 VITALS — BP 122/66 | HR 70 | Temp 98.9°F | Resp 16 | Ht 72.0 in | Wt 205.0 lb

## 2023-02-08 DIAGNOSIS — C61 Malignant neoplasm of prostate: Secondary | ICD-10-CM | POA: Insufficient documentation

## 2023-02-08 DIAGNOSIS — Z01818 Encounter for other preprocedural examination: Secondary | ICD-10-CM | POA: Insufficient documentation

## 2023-02-08 DIAGNOSIS — I10 Essential (primary) hypertension: Secondary | ICD-10-CM | POA: Diagnosis not present

## 2023-02-08 HISTORY — DX: Essential (primary) hypertension: I10

## 2023-02-08 HISTORY — DX: Malignant (primary) neoplasm, unspecified: C80.1

## 2023-02-08 LAB — CBC
HCT: 39.7 % (ref 39.0–52.0)
Hemoglobin: 13.2 g/dL (ref 13.0–17.0)
MCH: 29.3 pg (ref 26.0–34.0)
MCHC: 33.2 g/dL (ref 30.0–36.0)
MCV: 88 fL (ref 80.0–100.0)
Platelets: 225 10*3/uL (ref 150–400)
RBC: 4.51 MIL/uL (ref 4.22–5.81)
RDW: 14.2 % (ref 11.5–15.5)
WBC: 5.5 10*3/uL (ref 4.0–10.5)
nRBC: 0 % (ref 0.0–0.2)

## 2023-02-08 LAB — BASIC METABOLIC PANEL
Anion gap: 8 (ref 5–15)
BUN: 16 mg/dL (ref 6–20)
CO2: 24 mmol/L (ref 22–32)
Calcium: 8.7 mg/dL — ABNORMAL LOW (ref 8.9–10.3)
Chloride: 104 mmol/L (ref 98–111)
Creatinine, Ser: 1.24 mg/dL (ref 0.61–1.24)
GFR, Estimated: >60 mL/min (ref >60)
Glucose, Bld: 109 mg/dL — ABNORMAL HIGH (ref 70–99)
Potassium: 4.2 mmol/L (ref 3.5–5.1)
Sodium: 136 mmol/L (ref 135–145)

## 2023-02-12 ENCOUNTER — Encounter (HOSPITAL_COMMUNITY): Payer: Self-pay | Admitting: Urology

## 2023-02-12 NOTE — H&P (Signed)
Office Visit Report     02/02/2023   --------------------------------------------------------------------------------   Joshua Bautista. Lipsett  MRN: [id number]  DOB: 1970/07/01, 53 year old Male  SSN: [id number]   PRIMARY CARE:     REFERRING:  Renford Dills  PROVIDER:  [name], M.D.  LOCATION:  [facility] [phone]     --------------------------------------------------------------------------------   CC/HPI: CC: Prostate Cancer    Joshua Bautista is a 53 year old gentleman who was found to have an elevated PSA of 6.79 prompting an MRI of the prostate on 08/06/22 that confirmed a 9 mm PI-RADS 3 lesion of the left mid/base peripheral zone. An MR/US fusion biopsy was then performed on 08/19/22 that confirmed Gleason 3+4=7 adenocarcinoma with 5 out of 15 biopsies positive for malignancy including 4 out of 12 systematic biopsy cores and 1 out of 3 targeted biopsies.   He returns today after having made the decision to proceed with surgical treatment of his prostate cancer. Since his last visit with me in August, he did undergo a laparoscopic cholecystectomy in November and has recovered well from this.   Family history: None.   Imaging studies: MRI (08/06/22) - No EPE, SVI, LAD, or bone lesions.   PMH: He has no medical comorbidities.  PSH: Laparoscopic cholecystectomy   TNM stage: cT1c N0 Mx  PSA: 6.79  Gleason score: 3+4=7 (GG 2)  Biopsy (08/19/22): 5/15 cores positive  Left: L lateral apex (10%, 3+4=7), L apex (20%, 3+4=7), L mid (5%, 3+4=7), L lateral base (5%, 3+3=6)  Right: Benign  ROI: 1/3 cores (5%, 3+3=6)  Prostate volume: 73.7 cc   Nomogram  OC disease: 68%  EPE: 30%  SVI: 3%  LNI: 4%  PFS (5 year, 10 year): 82%, 71%   Urinary function: IPSS is 3.  SHIM score: 25     ALLERGIES: No Allergies    MEDICATIONS: None   GU PSH: Prostate Needle Biopsy - 08/19/2022     NON-GU PSH: Remove Gallbladder - 11/27/2022 Surgical Pathology, Gross And  Microscopic Examination For Prostate Needle - 08/19/2022 Visit Complexity (formerly GPC1X) - 06/10/2022     GU PMH: Prostate Cancer - 11/04/2022, - 09/22/2022, - 08/26/2022 Elevated PSA - 08/19/2022, - 06/10/2022 Encounter for Prostate Cancer screening - 06/10/2022    NON-GU PMH: Muscle weakness (generalized) - 11/04/2022    FAMILY HISTORY: 2 daughters - Daughter 2 sons - Son   SOCIAL HISTORY: Marital Status: Divorced Does not use smokeless tobacco. Has never drank.  Drinks 1 caffeinated drink per day.    REVIEW OF SYSTEMS:    GU Review Male:   Patient denies frequent urination, hard to postpone urination, burning/ pain with urination, get up at night to urinate, leakage of urine, stream starts and stops, trouble starting your streams, and have to strain to urinate .  Gastrointestinal (Lower):   Patient denies diarrhea and constipation.  Gastrointestinal (Upper):   Patient denies nausea and vomiting.  Constitutional:   Patient denies fever, night sweats, weight loss, and fatigue.  Skin:   Patient denies skin rash/ lesion and itching.  Eyes:   Patient denies blurred vision and double vision.  Ears/ Nose/ Throat:   Patient denies sore throat and sinus problems.  Hematologic/Lymphatic:   Patient denies swollen glands and easy bruising.  Cardiovascular:   Patient denies leg swelling and chest pains.  Respiratory:   Patient denies cough and shortness of breath.  Endocrine:   Patient denies excessive thirst.  Musculoskeletal:   Patient denies back  pain and joint pain.  Neurological:   Patient denies headaches and dizziness.  Psychologic:   Patient denies depression and anxiety.   VITAL SIGNS:      02/02/2023 08:11 AM  Weight 200 lb / 90.72 kg  Height 72 in / 182.88 cm  BP 122/77 mmHg  Pulse 76 /min  Temperature 97.3 F / 36.2 C  BMI 27.1 kg/m   GU PHYSICAL EXAMINATION:    Prostate: Prostate about 40 grams. Left lobe normal consistency, right lobe normal consistency. Symmetrical  lobes. No prostate nodule. Left lobe no tenderness, right lobe no tenderness.    MULTI-SYSTEM PHYSICAL EXAMINATION:    Constitutional: Well-nourished. No physical deformities. Normally developed. Good grooming.  Respiratory: No labored breathing, no use of accessory muscles. Clear bilaterally.  Cardiovascular: Normal temperature, normal extremity pulses, no swelling, no varicosities. Regular rate and rhythm.  Gastrointestinal: No mass, no tenderness, no rigidity, non obese abdomen. Well-healed laparoscopic incisions.     Complexity of Data:  Lab Test Review:   PSA  Records Review:   Pathology Reports, Previous Patient Records   06/10/22  PSA  Total PSA 6.79 ng/mL    PROCEDURES:          Urinalysis Dipstick Dipstick Cont'd  Color: Yellow Bilirubin: Neg mg/dL  Appearance: Clear Ketones: Neg mg/dL  Specific Gravity: 1.610 Blood: Neg ery/uL  pH: 6.0 Protein: Neg mg/dL  Glucose: Neg mg/dL Urobilinogen: 0.2 mg/dL    Nitrites: Neg    Leukocyte Esterase: Neg leu/uL    ASSESSMENT:      ICD-10 Details  1 GU:   Prostate Cancer - C61    PLAN:           Schedule Return Visit/Planned Activity: Keep Scheduled Appointment          Document Letter(s):  Created for Patient: Clinical Summary         Notes:   1. Favorable intermediate risk prostate cancer: Joshua Bautista is confirmed his decision to proceed with surgical therapy and is scheduled for a bilateral nerve sparing robot-assisted laparoscopic radical prostatectomy and bilateral pelvic lymphadenectomy later this month. We discussed surgical therapy for prostate cancer including the different available surgical approaches. We discussed, in detail, the risks and expectations of surgery with regard to cancer control, urinary control, and erectile function as well as the expected postoperative recovery process. Additional risks of surgery including but not limited to bleeding, infection, hernia formation, nerve damage, lymphocele formation,  bowel/rectal injury potentially necessitating colostomy, damage to the urinary tract resulting in urine leakage, urethral stricture, and the cardiopulmonary risks such as myocardial infarction, stroke, death, venothromboembolism, etc. were explained. The risk of open surgical conversion for robotic/laparoscopic prostatectomy was also discussed.   All questions were answered to his stated satisfaction. He will proceed with surgical treatment as planned.   CC: Dr. Renford Dills  Dr. Verdia Kuba        Next Appointment:      Next Appointment: 02/15/2023 11:15 AM    Appointment Type: Surgery     Location: [facility] [phone]    Provider: [name], M.D.    Reason for Visit: WL/OBS RA LAP RAD PROSTATECTOMY AND BPLND WITH AMANDA      E & M CODES: We spent 36 minutes dedicated to evaluation and management time, including face to face interaction, discussions on coordination of care, documentation, result review, and discussion with others as applicable.     * Signed by [name], M.D. on 02/02/23 at 3:22  PM (EST)*

## 2023-02-15 ENCOUNTER — Ambulatory Visit (HOSPITAL_COMMUNITY): Payer: Self-pay | Admitting: Anesthesiology

## 2023-02-15 ENCOUNTER — Other Ambulatory Visit: Payer: Self-pay

## 2023-02-15 ENCOUNTER — Encounter (HOSPITAL_COMMUNITY)
Admission: RE | Disposition: A | Discharge status: Home / Self Care | Payer: Self-pay | Source: Ambulatory Visit | Attending: Urology

## 2023-02-15 ENCOUNTER — Encounter (HOSPITAL_COMMUNITY): Payer: Self-pay | Admitting: Urology

## 2023-02-15 ENCOUNTER — Observation Stay (HOSPITAL_COMMUNITY)
Admission: RE | Admit: 2023-02-15 | Discharge: 2023-02-16 | Disposition: A | Discharge status: Home / Self Care | Payer: BC Managed Care – PPO | Source: Ambulatory Visit | Attending: Urology | Admitting: Urology

## 2023-02-15 DIAGNOSIS — C61 Malignant neoplasm of prostate: Principal | ICD-10-CM | POA: Insufficient documentation

## 2023-02-15 HISTORY — PX: ROBOT ASSISTED LAPAROSCOPIC RADICAL PROSTATECTOMY: SHX5141

## 2023-02-15 HISTORY — PX: LYMPHADENECTOMY: SHX5960

## 2023-02-15 LAB — ABO/RH: ABO/RH(D): A POS

## 2023-02-15 LAB — TYPE AND SCREEN
ABO/RH(D): A POS
Antibody Screen: NEGATIVE

## 2023-02-15 LAB — HEMOGLOBIN AND HEMATOCRIT, BLOOD
HCT: 32.9 % — ABNORMAL LOW (ref 39.0–52.0)
Hemoglobin: 12 g/dL — ABNORMAL LOW (ref 13.0–17.0)

## 2023-02-15 SURGERY — XI ROBOTIC ASSISTED LAPAROSCOPIC RADICAL PROSTATECTOMY LEVEL 2
Anesthesia: General | Site: Abdomen

## 2023-02-15 MED ORDER — MORPHINE SULFATE (PF) 2 MG/ML IV SOLN
2.0000 mg | INTRAVENOUS | Status: DC | PRN
Start: 2023-02-15 — End: 2023-02-16
  Administered 2023-02-15: 4 mg via INTRAVENOUS
  Filled 2023-02-15: qty 2

## 2023-02-15 MED ORDER — HYDROMORPHONE HCL 1 MG/ML IJ SOLN
INTRAMUSCULAR | Status: DC | PRN
  Administered 2023-02-15: 1 mg via INTRAVENOUS
  Administered 2023-02-15: .5 mg via INTRAVENOUS

## 2023-02-15 MED ORDER — ORAL CARE MOUTH RINSE: 15.0000 mL | Freq: Once | OROMUCOSAL | Status: AC

## 2023-02-15 MED ORDER — PROPOFOL 10 MG/ML IV BOLUS
INTRAVENOUS | Status: DC | PRN
  Administered 2023-02-15: 200 mg via INTRAVENOUS

## 2023-02-15 MED ORDER — HYOSCYAMINE SULFATE 0.125 MG SL SUBL: 0.1250 mg | SUBLINGUAL_TABLET | Freq: Four times a day (QID) | SUBLINGUAL | Status: DC | PRN

## 2023-02-15 MED ORDER — SULFAMETHOXAZOLE-TRIMETHOPRIM 800-160 MG PO TABS: 1.0000 | ORAL_TABLET | Freq: Two times a day (BID) | ORAL | 0 refills | Status: AC

## 2023-02-15 MED ORDER — EPHEDRINE 5 MG/ML INJ
INTRAVENOUS | Status: AC
  Filled 2023-02-15: qty 5

## 2023-02-15 MED ORDER — BUPIVACAINE-EPINEPHRINE 0.25% -1:200000 IJ SOLN
INTRAMUSCULAR | Status: AC
  Filled 2023-02-15: qty 1

## 2023-02-15 MED ORDER — BUPIVACAINE-EPINEPHRINE 0.25% -1:200000 IJ SOLN
INTRAMUSCULAR | Status: DC | PRN
  Administered 2023-02-15: 28 mL

## 2023-02-15 MED ORDER — FLEET ENEMA RE ENEM: 1.0000 | ENEMA | Freq: Once | RECTAL | Status: DC

## 2023-02-15 MED ORDER — ONDANSETRON HCL 4 MG/2ML IJ SOLN: 4.0000 mg | INTRAMUSCULAR | Status: DC | PRN

## 2023-02-15 MED ORDER — HYDROMORPHONE HCL 1 MG/ML IJ SOLN
INTRAMUSCULAR | Status: AC
  Administered 2023-02-15: 0.5 mg via INTRAVENOUS
  Filled 2023-02-15: qty 1

## 2023-02-15 MED ORDER — DEXAMETHASONE SODIUM PHOSPHATE 10 MG/ML IJ SOLN
INTRAMUSCULAR | Status: DC | PRN
  Administered 2023-02-15: 10 mg via INTRAVENOUS

## 2023-02-15 MED ORDER — KETAMINE HCL 10 MG/ML IJ SOLN
INTRAMUSCULAR | Status: DC | PRN
  Administered 2023-02-15: 50 mg via INTRAVENOUS

## 2023-02-15 MED ORDER — KETAMINE HCL 50 MG/5ML IJ SOSY
PREFILLED_SYRINGE | INTRAMUSCULAR | Status: AC
  Filled 2023-02-15: qty 5

## 2023-02-15 MED ORDER — CHLORHEXIDINE GLUCONATE 0.12 % MT SOLN
15.0000 mL | Freq: Once | OROMUCOSAL | Status: AC
  Administered 2023-02-15: 15 mL via OROMUCOSAL

## 2023-02-15 MED ORDER — TRAMADOL HCL 50 MG PO TABS: 50.0000 mg | ORAL_TABLET | Freq: Four times a day (QID) | ORAL | 0 refills | Status: DC | PRN

## 2023-02-15 MED ORDER — MIDAZOLAM HCL 5 MG/5ML IJ SOLN
INTRAMUSCULAR | Status: DC | PRN
  Administered 2023-02-15: 2 mg via INTRAVENOUS

## 2023-02-15 MED ORDER — STERILE WATER FOR IRRIGATION IR SOLN
Status: DC | PRN
  Administered 2023-02-15: 1000 mL

## 2023-02-15 MED ORDER — DIPHENHYDRAMINE HCL 50 MG/ML IJ SOLN: 12.5000 mg | Freq: Four times a day (QID) | INTRAMUSCULAR | Status: DC | PRN

## 2023-02-15 MED ORDER — HYDROMORPHONE HCL 1 MG/ML IJ SOLN
0.2500 mg | INTRAMUSCULAR | Status: DC | PRN
  Administered 2023-02-15 (×2): 0.5 mg via INTRAVENOUS

## 2023-02-15 MED ORDER — CEFAZOLIN SODIUM-DEXTROSE 1-4 GM/50ML-% IV SOLN
1.0000 g | Freq: Three times a day (TID) | INTRAVENOUS | Status: AC
  Administered 2023-02-15 – 2023-02-16 (×2): 1 g via INTRAVENOUS
  Filled 2023-02-15 (×2): qty 50

## 2023-02-15 MED ORDER — HYDROMORPHONE HCL 2 MG/ML IJ SOLN
INTRAMUSCULAR | Status: AC
  Filled 2023-02-15: qty 1

## 2023-02-15 MED ORDER — CEFAZOLIN SODIUM-DEXTROSE 2-4 GM/100ML-% IV SOLN
2.0000 g | INTRAVENOUS | Status: AC
  Administered 2023-02-15: 2 g via INTRAVENOUS
  Filled 2023-02-15: qty 100

## 2023-02-15 MED ORDER — DOCUSATE SODIUM 100 MG PO CAPS
100.0000 mg | ORAL_CAPSULE | Freq: Two times a day (BID) | ORAL | Status: DC
  Administered 2023-02-15 – 2023-02-16 (×2): 100 mg via ORAL
  Filled 2023-02-15 (×2): qty 1

## 2023-02-15 MED ORDER — KETOROLAC TROMETHAMINE 15 MG/ML IJ SOLN
15.0000 mg | Freq: Four times a day (QID) | INTRAMUSCULAR | Status: DC
  Administered 2023-02-15 – 2023-02-16 (×4): 15 mg via INTRAVENOUS
  Filled 2023-02-15 (×4): qty 1

## 2023-02-15 MED ORDER — ZOLPIDEM TARTRATE 5 MG PO TABS
5.0000 mg | ORAL_TABLET | Freq: Every evening | ORAL | Status: DC | PRN
  Administered 2023-02-15: 5 mg via ORAL
  Filled 2023-02-15: qty 1

## 2023-02-15 MED ORDER — PROPOFOL 10 MG/ML IV BOLUS
INTRAVENOUS | Status: AC
  Filled 2023-02-15: qty 20

## 2023-02-15 MED ORDER — DIPHENHYDRAMINE HCL 12.5 MG/5ML PO ELIX: 12.5000 mg | ORAL_SOLUTION | Freq: Four times a day (QID) | ORAL | Status: DC | PRN

## 2023-02-15 MED ORDER — DROPERIDOL 2.5 MG/ML IJ SOLN: 0.6250 mg | Freq: Once | INTRAMUSCULAR | Status: DC | PRN

## 2023-02-15 MED ORDER — PHENYLEPHRINE HCL-NACL 20-0.9 MG/250ML-% IV SOLN
INTRAVENOUS | Status: DC | PRN
  Administered 2023-02-15: 25 ug/min via INTRAVENOUS

## 2023-02-15 MED ORDER — HYDROMORPHONE HCL 1 MG/ML IJ SOLN
0.2500 mg | INTRAMUSCULAR | Status: AC | PRN
  Administered 2023-02-15 (×3): 0.25 mg via INTRAVENOUS

## 2023-02-15 MED ORDER — SODIUM CHLORIDE 0.9 % IV BOLUS
1000.0000 mL | Freq: Once | INTRAVENOUS | Status: AC
  Administered 2023-02-15: 1000 mL via INTRAVENOUS

## 2023-02-15 MED ORDER — ROCURONIUM BROMIDE 10 MG/ML (PF) SYRINGE
PREFILLED_SYRINGE | INTRAVENOUS | Status: DC | PRN
  Administered 2023-02-15: 30 mg via INTRAVENOUS
  Administered 2023-02-15: 10 mg via INTRAVENOUS
  Administered 2023-02-15: 60 mg via INTRAVENOUS
  Administered 2023-02-15: 10 mg via INTRAVENOUS
  Administered 2023-02-15: 20 mg via INTRAVENOUS

## 2023-02-15 MED ORDER — SUGAMMADEX SODIUM 200 MG/2ML IV SOLN
INTRAVENOUS | Status: DC | PRN
  Administered 2023-02-15: 200 mg via INTRAVENOUS

## 2023-02-15 MED ORDER — DOCUSATE SODIUM 100 MG PO CAPS: 100.0000 mg | ORAL_CAPSULE | Freq: Two times a day (BID) | ORAL | Status: DC

## 2023-02-15 MED ORDER — ROCURONIUM BROMIDE 10 MG/ML (PF) SYRINGE
PREFILLED_SYRINGE | INTRAVENOUS | Status: AC
  Filled 2023-02-15: qty 10

## 2023-02-15 MED ORDER — TRIPLE ANTIBIOTIC 3.5-400-5000 EX OINT: 1.0000 | TOPICAL_OINTMENT | Freq: Three times a day (TID) | CUTANEOUS | Status: DC | PRN

## 2023-02-15 MED ORDER — MAGNESIUM CITRATE PO SOLN: 1.0000 | Freq: Once | ORAL | Status: DC

## 2023-02-15 MED ORDER — EPHEDRINE SULFATE-NACL 50-0.9 MG/10ML-% IV SOSY
PREFILLED_SYRINGE | INTRAVENOUS | Status: DC | PRN
  Administered 2023-02-15 (×2): 5 mg via INTRAVENOUS
  Administered 2023-02-15: 7.5 mg via INTRAVENOUS

## 2023-02-15 MED ORDER — ONDANSETRON HCL 4 MG/2ML IJ SOLN
INTRAMUSCULAR | Status: DC | PRN
  Administered 2023-02-15: 4 mg via INTRAVENOUS

## 2023-02-15 MED ORDER — LACTATED RINGERS IV SOLN: INTRAVENOUS | Status: DC

## 2023-02-15 MED ORDER — LACTATED RINGERS IV SOLN: INTRAVENOUS | Status: DC | PRN

## 2023-02-15 MED ORDER — HYDROMORPHONE HCL 1 MG/ML IJ SOLN
INTRAMUSCULAR | Status: AC
  Administered 2023-02-15: 0.25 mg via INTRAVENOUS
  Filled 2023-02-15: qty 1

## 2023-02-15 MED ORDER — FENTANYL CITRATE (PF) 100 MCG/2ML IJ SOLN
INTRAMUSCULAR | Status: AC
  Filled 2023-02-15: qty 2

## 2023-02-15 MED ORDER — MIDAZOLAM HCL 2 MG/2ML IJ SOLN
INTRAMUSCULAR | Status: AC
  Filled 2023-02-15: qty 2

## 2023-02-15 MED ORDER — LIDOCAINE 2% (20 MG/ML) 5 ML SYRINGE
INTRAMUSCULAR | Status: DC | PRN
  Administered 2023-02-15: 100 mg via INTRAVENOUS

## 2023-02-15 MED ORDER — FENTANYL CITRATE (PF) 100 MCG/2ML IJ SOLN
INTRAMUSCULAR | Status: DC | PRN
  Administered 2023-02-15: 100 ug via INTRAVENOUS

## 2023-02-15 MED ORDER — KCL IN DEXTROSE-NACL 20-5-0.45 MEQ/L-%-% IV SOLN
INTRAVENOUS | Status: DC
  Filled 2023-02-15 (×5): qty 1000

## 2023-02-15 MED ORDER — ACETAMINOPHEN 500 MG PO TABS
1000.0000 mg | ORAL_TABLET | Freq: Once | ORAL | Status: AC
  Administered 2023-02-15: 1000 mg via ORAL
  Filled 2023-02-15: qty 2

## 2023-02-15 MED ORDER — ACETAMINOPHEN 325 MG PO TABS: 650.0000 mg | ORAL_TABLET | ORAL | Status: DC | PRN

## 2023-02-15 SURGICAL SUPPLY — 61 items
APPLICATOR COTTON TIP 6 STRL (MISCELLANEOUS) ×2 IMPLANT
APPLICATOR COTTON TIP 6IN STRL (MISCELLANEOUS) ×2 IMPLANT
BAG COUNTER SPONGE SURGICOUNT (BAG) IMPLANT
CATH SILICON 18FR 30CC (CATHETERS) IMPLANT
CATH SILICONE 16FRX5CC (CATHETERS) IMPLANT
CATH SILICONE 5CC 18FR (INSTRUMENTS) IMPLANT
CHLORAPREP W/TINT 26 (MISCELLANEOUS) ×2 IMPLANT
CLIP LIGATING HEM O LOK PURPLE (MISCELLANEOUS) ×2 IMPLANT
COVER SURGICAL LIGHT HANDLE (MISCELLANEOUS) ×2 IMPLANT
COVER TIP SHEARS 8 DVNC (MISCELLANEOUS) ×2 IMPLANT
CUTTER ECHEON FLEX ENDO 45 340 (ENDOMECHANICALS) ×2 IMPLANT
DERMABOND ADVANCED .7 DNX12 (GAUZE/BANDAGES/DRESSINGS) ×2 IMPLANT
DRAIN CHANNEL RND F F (WOUND CARE) IMPLANT
DRAPE ARM DVNC X/XI (DISPOSABLE) ×8 IMPLANT
DRAPE COLUMN DVNC XI (DISPOSABLE) ×2 IMPLANT
DRAPE SURG IRRIG POUCH 19X23 (DRAPES) ×2 IMPLANT
DRIVER NDL LRG 8 DVNC XI (INSTRUMENTS) ×4 IMPLANT
DRIVER NDLE LRG 8 DVNC XI (INSTRUMENTS) ×4 IMPLANT
DRSG TEGADERM 4X4.75 (GAUZE/BANDAGES/DRESSINGS) ×2 IMPLANT
ELECT PENCIL ROCKER SW 15FT (MISCELLANEOUS) ×2 IMPLANT
ELECT REM PT RETURN 15FT ADLT (MISCELLANEOUS) ×2 IMPLANT
FORCEPS BPLR LNG DVNC XI (INSTRUMENTS) ×2 IMPLANT
FORCEPS PROGRASP DVNC XI (FORCEP) ×2 IMPLANT
GAUZE SPONGE 4X4 12PLY STRL (GAUZE/BANDAGES/DRESSINGS) ×2 IMPLANT
GLOVE BIOGEL PI IND STRL 6.5 (GLOVE) IMPLANT
GLOVE BIOGEL PI IND STRL 7.0 (GLOVE) IMPLANT
GLOVE BIOGEL PI IND STRL 7.5 (GLOVE) IMPLANT
GLOVE SURG SS PI 7.5 STRL IVOR (GLOVE) IMPLANT
GLOVE SURG SYN 7.0 (GLOVE) ×2 IMPLANT
GLOVE SURG SYN 7.0 PF PI (GLOVE) IMPLANT
GOWN STRL REUS W/ TWL XL LVL3 (GOWN DISPOSABLE) ×4 IMPLANT
GOWN STRL SURGICAL XL XLNG (GOWN DISPOSABLE) ×2 IMPLANT
HEMOSTAT SURGICEL 4X8 (HEMOSTASIS) IMPLANT
HOLDER FOLEY CATH W/STRAP (MISCELLANEOUS) ×2 IMPLANT
IRRIG SUCT STRYKERFLOW 2 WTIP (MISCELLANEOUS) ×2 IMPLANT
IRRIGATION SUCT STRKRFLW 2 WTP (MISCELLANEOUS) ×2 IMPLANT
IV LACTATED RINGERS 1000ML (IV SOLUTION) ×2 IMPLANT
KIT TURNOVER KIT A (KITS) IMPLANT
NDL SAFETY ECLIPSE 18X1.5 (NEEDLE) IMPLANT
PACK ROBOT UROLOGY CUSTOM (CUSTOM PROCEDURE TRAY) ×2 IMPLANT
PLUG CATH AND CAP STRL 200 (CATHETERS) ×2 IMPLANT
RELOAD STAPLE 45 4.1 GRN THCK (STAPLE) ×2 IMPLANT
SCISSORS MNPLR CVD DVNC XI (INSTRUMENTS) ×2 IMPLANT
SEAL UNIV 5-12 XI (MISCELLANEOUS) ×8 IMPLANT
SET CYSTO W/LG BORE CLAMP LF (SET/KITS/TRAYS/PACK) IMPLANT
SET TUBE SMOKE EVAC HIGH FLOW (TUBING) ×2 IMPLANT
SOL ELECTROSURG ANTI STICK (MISCELLANEOUS) ×2 IMPLANT
SOL PREP POV-IOD 4OZ 10% (MISCELLANEOUS) ×2 IMPLANT
SOLUTION ELECTROSURG ANTI STCK (MISCELLANEOUS) ×2 IMPLANT
SPIKE FLUID TRANSFER (MISCELLANEOUS) ×2 IMPLANT
STAPLE RELOAD 45 GRN (STAPLE) ×2 IMPLANT
SUT ETHILON 3 0 PS 1 (SUTURE) ×2 IMPLANT
SUT MNCRL 3 0 RB1 (SUTURE) ×2 IMPLANT
SUT MNCRL 3 0 VIOLET RB1 (SUTURE) ×2 IMPLANT
SUT MNCRL AB 4-0 PS2 18 (SUTURE) ×4 IMPLANT
SUT PDS PLUS AB 0 CT-2 (SUTURE) ×4 IMPLANT
SUT VIC AB 0 CT1 27XBRD ANTBC (SUTURE) ×4 IMPLANT
SUT VIC AB 2-0 SH 27X BRD (SUTURE) ×2 IMPLANT
SUT VIC AB 3-0 SH 27X BRD (SUTURE) IMPLANT
TROCAR Z THREAD OPTICAL 12X100 (TROCAR) IMPLANT
WATER STERILE IRR 1000ML POUR (IV SOLUTION) ×2 IMPLANT

## 2023-02-15 NOTE — Plan of Care (Signed)

## 2023-02-15 NOTE — Interval H&P Note (Signed)
History and Physical Interval Note:  02/15/2023 10:46 AM  Joshua Bautista  has presented today for surgery, with the diagnosis of PROSTATE CANCER.  The various methods of treatment have been discussed with the patient and family. After consideration of risks, benefits and other options for treatment, the patient has consented to  Procedure(s) with comments: XI ROBOTIC ASSISTED LAPAROSCOPIC RADICAL PROSTATECTOMY LEVEL 2 (N/A) - 210 MINUTES NEEDED FOR CASE BILATERAL PELVIC LYMPHADENECTOMY (Bilateral) as a surgical intervention.  The patient's history has been reviewed, patient examined, no change in status, stable for surgery.  I have reviewed the patient's chart and labs.  Questions were answered to the patient's satisfaction.     [employer]

## 2023-02-15 NOTE — Transfer of Care (Signed)
Immediate Anesthesia Transfer of Care Note  Patient: MICHAIL GARTEN  Procedure(s) Performed: Procedure(s) with comments: XI ROBOTIC ASSISTED LAPAROSCOPIC RADICAL PROSTATECTOMY LEVEL 2 (N/A) - 210 MINUTES NEEDED FOR CASE BILATERAL PELVIC LYMPHADENECTOMY (Bilateral)  Patient Location: PACU  Anesthesia Type:General  Level of Consciousness:  sedated, patient cooperative and responds to stimulation  Airway & Oxygen Therapy:Patient Spontanous Breathing and Patient connected to face mask oxgen  Post-op Assessment:  Report given to PACU RN and Post -op Vital signs reviewed and stable  Post vital signs:  Reviewed and stable  Last Vitals:  Vitals:   02/15/23 0923  BP: (!) 156/96  Pulse: (!) 59  Resp: 16  Temp: 36.8 C  SpO2: 99%    Complications: No apparent anesthesia complications

## 2023-02-15 NOTE — Op Note (Signed)
Preoperative diagnosis: Clinically localized adenocarcinoma of the prostate (clinical stage T1c N0 Mx)  Postoperative diagnosis: Clinically localized adenocarcinoma of the prostate (clinical stage T1c N0 Mx)  Procedure:  Robotic assisted laparoscopic radical prostatectomy (bilateral nerve sparing) Bilateral robotic assisted laparoscopic pelvic lymphadenectomy  Surgeon: Moody Bruins. M.D.  Assistant: Harrie Foreman, PA-C  An assistant was required for this surgical procedure.  The duties of the assistant included but were not limited to suctioning, passing suture, camera manipulation, retraction. This procedure would not be able to be performed without an Geophysicist/field seismologist.  Resident: Dr. Jerald Kief  Anesthesia: General  Complications: None  EBL: 300 mL  IVF:  1000 mL crystalloid  Specimens: Prostate and seminal vesicles Right pelvic lymph nodes Left pelvic lymph nodes  Disposition of specimens: Pathology  Drains: 20 Fr coude catheter # 19 Blake pelvic drain  Indication: Joshua Bautista is a 53 y.o. year old patient with clinically localized prostate cancer.  After a thorough review of the management options for treatment of prostate cancer, he elected to proceed with surgical therapy and the above procedure(s).  We have discussed the potential benefits and risks of the procedure, side effects of the proposed treatment, the likelihood of the patient achieving the goals of the procedure, and any potential problems that might occur during the procedure or recuperation. Informed consent has been obtained.  Description of procedure:  The patient was taken to the operating room and a general anesthetic was administered. He was given preoperative antibiotics, placed in the dorsal lithotomy position, and prepped and draped in the usual sterile fashion. Next a preoperative timeout was performed. A urethral catheter was placed into the bladder and a site was selected near the  umbilicus for placement of the camera port. This was placed using a standard open Hassan technique which allowed entry into the peritoneal cavity under direct vision and without difficulty. An 8 mm robotic port was placed and a pneumoperitoneum established. The camera was then used to inspect the abdomen and there was no evidence of any intra-abdominal injuries or other abnormalities. The remaining abdominal ports were then placed. 8 mm robotic ports were placed in the right lower quadrant, left lower quadrant, and far left lateral abdominal wall. A 5 mm port was placed in the right upper quadrant and a 12 mm port was placed in the right lateral abdominal wall for laparoscopic assistance. All ports were placed under direct vision without difficulty. The surgical cart was then docked.   Utilizing the cautery scissors, the bladder was reflected posteriorly allowing entry into the space of Retzius and identification of the endopelvic fascia and prostate. The periprostatic fat was then removed from the prostate allowing full exposure of the endopelvic fascia. The endopelvic fascia was then incised from the apex back to the base of the prostate bilaterally and the underlying levator muscle fibers were swept laterally off the prostate thereby isolating the dorsal venous complex. The dorsal vein was then stapled and divided with a 45 mm Flex Echelon stapler. Attention then turned to the bladder neck which was divided anteriorly thereby allowing entry into the bladder and exposure of the urethral catheter. The catheter balloon was deflated and the catheter was brought into the operative field and used to retract the prostate anteriorly. The posterior bladder neck was then examined and was divided allowing further dissection between the bladder and prostate posteriorly until the vasa deferentia and seminal vessels were identified. The vasa deferentia were isolated, divided, and lifted anteriorly. The seminal  vesicles were  dissected down to their tips with care to control the seminal vascular arterial blood supply. These structures were then lifted anteriorly and the space between Denonvillier's fascia and the anterior rectum was developed with a combination of sharp and blunt dissection. This isolated the vascular pedicles of the prostate.  The lateral prostatic fascia was then sharply incised allowing release of the neurovascular bundles bilaterally. The vascular pedicles of the prostate were then ligated with Weck clips between the prostate and neurovascular bundles and divided with sharp cold scissor dissection resulting in neurovascular bundle preservation. The neurovascular bundles were then separated off the apex of the prostate and urethra bilaterally.  The urethra was then sharply transected allowing the prostate specimen to be disarticulated. The pelvis was copiously irrigated and hemostasis was ensured. There was no evidence for rectal injury.  Attention then turned to the right pelvic sidewall. The fibrofatty tissue between the external iliac vein, confluence of the iliac vessels, hypogastric artery, and Cooper's ligament was dissected free from the pelvic sidewall with care to preserve the obturator nerve. Weck clips were used for lymphostasis and hemostasis. An identical procedure was performed on the contralateral side and the lymphatic packets were removed for permanent pathologic analysis.  Attention then turned to the urethral anastomosis. A 2-0 Vicryl slip knot was placed between Denonvillier's fascia, the posterior bladder neck, and the posterior urethra to reapproximate these structures. A double-armed 3-0 Monocryl suture was then used to perform a 360 running tension-free anastomosis between the bladder neck and urethra. A new urethral catheter was then placed into the bladder and irrigated. There were no blood clots within the bladder and the anastomosis appeared to be watertight. A #19 Blake drain was  then brought through the left lateral 8 mm port site and positioned appropriately within the pelvis. It was secured to the skin with a nylon suture. The surgical cart was then undocked. The right lateral 12 mm port site was closed at the fascial level with a 0 Vicryl suture placed laparoscopically. All remaining ports were then removed under direct vision. The prostate specimen was removed intact within the Endopouch retrieval bag via the periumbilical camera port site. This fascial opening was closed with two running 0 PDS sutures. 0.25% Marcaine was then injected into all port sites and all incisions were reapproximated at the skin level with 4-0 Monocryl subcuticular sutures and Dermabond. The patient appeared to tolerate the procedure well and without complications. The patient was able to be extubated and transferred to the recovery unit in satisfactory condition.   Moody Bruins MD

## 2023-02-15 NOTE — Anesthesia Preprocedure Evaluation (Addendum)
Anesthesia Evaluation  Patient identified by MRN, date of birth, ID band Patient awake    Reviewed: Allergy & Precautions, NPO status , Patient's Chart, lab work & pertinent test results  History of Anesthesia Complications Negative for: history of anesthetic complications  Airway Mallampati: II  TM Distance: >3 FB Neck ROM: Full    Dental no notable dental hx.    Pulmonary neg pulmonary ROS   Pulmonary exam normal        Cardiovascular hypertension, Normal cardiovascular exam     Neuro/Psych negative neurological ROS     GI/Hepatic negative GI ROS, Neg liver ROS,,,  Endo/Other  negative endocrine ROS    Renal/GU negative Renal ROS     Musculoskeletal negative musculoskeletal ROS (+)    Abdominal   Peds  Hematology negative hematology ROS (+)   Anesthesia Other Findings Prostate ca  Reproductive/Obstetrics                              Anesthesia Physical Anesthesia Plan  ASA: 2  Anesthesia Plan: General   Post-op Pain Management: Tylenol PO (pre-op)*, Ketamine IV* and Dilaudid IV   Induction: Intravenous  PONV Risk Score and Plan: 2 and Treatment may vary due to age or medical condition, Ondansetron, Dexamethasone and Midazolam  Airway Management Planned: Oral ETT  Additional Equipment: None  Intra-op Plan:   Post-operative Plan: Extubation in OR  Informed Consent: I have reviewed the patients History and Physical, chart, labs and discussed the procedure including the risks, benefits and alternatives for the proposed anesthesia with the patient or authorized representative who has indicated his/her understanding and acceptance.     Dental advisory given  Plan Discussed with: CRNA  Anesthesia Plan Comments:         Anesthesia Quick Evaluation

## 2023-02-15 NOTE — Anesthesia Postprocedure Evaluation (Signed)
Anesthesia Post Note  Patient: CHANCEY BLAUSER  Procedure(s) Performed: XI ROBOTIC ASSISTED LAPAROSCOPIC RADICAL PROSTATECTOMY LEVEL 2 (Abdomen) BILATERAL PELVIC LYMPHADENECTOMY (Bilateral: Abdomen)     Patient location during evaluation: PACU Anesthesia Type: General Level of consciousness: awake and alert Pain management: pain level controlled Vital Signs Assessment: post-procedure vital signs reviewed and stable Respiratory status: spontaneous breathing, nonlabored ventilation and respiratory function stable Cardiovascular status: blood pressure returned to baseline Postop Assessment: no apparent nausea or vomiting Anesthetic complications: no   No notable events documented.  Last Vitals:  Vitals:   02/15/23 1615 02/15/23 1630  BP: (!) 161/97 (!) 159/95  Pulse: 74 76  Resp: 13 18  Temp:    SpO2: 94% 94%              Shanda Howells

## 2023-02-15 NOTE — Discharge Instructions (Signed)

## 2023-02-15 NOTE — Anesthesia Procedure Notes (Signed)
Procedure Name: Intubation Date/Time: 02/15/2023 12:26 PM  Performed by: Theodosia Quay, CRNAPre-anesthesia Checklist: Patient identified, Emergency Drugs available, Suction available, Patient being monitored and Timeout performed Patient Re-evaluated:Patient Re-evaluated prior to induction Oxygen Delivery Method: Circle system utilized Preoxygenation: Pre-oxygenation with 100% oxygen Induction Type: IV induction Ventilation: Mask ventilation without difficulty Laryngoscope Size: Mac and 4 Grade View: Grade I Tube type: Oral Tube size: 7.0 mm Number of attempts: 1 Airway Equipment and Method: Stylet Placement Confirmation: ETT inserted through vocal cords under direct vision, positive ETCO2, CO2 detector and breath sounds checked- equal and bilateral Secured at: 22 cm Tube secured with: Tape Dental Injury: Teeth and Oropharynx as per pre-operative assessment  Comments: ATOI

## 2023-02-15 NOTE — Progress Notes (Signed)
Patient ID: Joshua Bautista, male   DOB: 1970-04-09, 53 y.o.   MRN: [id number]  Post-op note  Subjective: The patient is doing well.  No complaints.  Objective: Vital signs in last 24 hours: Temp:  [97.4 F (36.3 C)-98.3 F (36.8 C)] 97.4 F (36.3 C) (01/20 1812) Pulse Rate:  [59-79] 68 (01/20 1812) Resp:  [12-20] 20 (01/20 1812) BP: (149-170)/(83-97) 158/90 (01/20 1812) SpO2:  [93 %-100 %] 96 % (01/20 1812) Weight:  [93 kg] 93 kg (01/20 0946)  Intake/Output from previous day: No intake/output data recorded. Intake/Output this shift: No intake/output data recorded.  Physical Exam:  General: Alert and oriented. Abdomen: Soft, Nondistended. Incisions: Clean and dry. GU: Urine clearing.  Lab Results: Recent Labs    02/15/23 1545  HGB 12.0*  HCT 32.9*    Assessment/Plan: POD#0   1) Continue to monitor, ambulate, IS   Joshua Bautista. MD   LOS: 0 days   Joshua Bautista 02/15/2023, 8:15 PM

## 2023-02-16 ENCOUNTER — Encounter (HOSPITAL_COMMUNITY): Payer: Self-pay | Admitting: Urology

## 2023-02-16 DIAGNOSIS — C61 Malignant neoplasm of prostate: Secondary | ICD-10-CM | POA: Diagnosis not present

## 2023-02-16 LAB — HEMOGLOBIN AND HEMATOCRIT, BLOOD
HCT: 33.2 % — ABNORMAL LOW (ref 39.0–52.0)
Hemoglobin: 11.3 g/dL — ABNORMAL LOW (ref 13.0–17.0)

## 2023-02-16 MED ORDER — TRAMADOL HCL 50 MG PO TABS
50.0000 mg | ORAL_TABLET | Freq: Four times a day (QID) | ORAL | Status: DC | PRN
Start: 2023-02-16 — End: 2023-02-16

## 2023-02-16 MED ORDER — LIDOCAINE HCL URETHRAL/MUCOSAL 2 % EX GEL
1.0000 | Freq: Once | CUTANEOUS | Status: AC
  Administered 2023-02-16: 1 via TOPICAL
  Filled 2023-02-16: qty 5

## 2023-02-16 MED ORDER — BISACODYL 10 MG RE SUPP
10.0000 mg | Freq: Once | RECTAL | Status: DC
  Filled 2023-02-16: qty 1

## 2023-02-16 NOTE — Plan of Care (Signed)
  Problem: Education: Goal: Knowledge of the procedure and recovery process will improve Outcome: Progressing   Problem: Bowel/Gastric: Goal: Gastrointestinal status for postoperative course will improve Outcome: Progressing   Problem: Pain Management: Goal: General experience of comfort will improve Outcome: Progressing   Problem: Skin Integrity: Goal: Demonstration of wound healing without infection will improve Outcome: Progressing   Problem: Urinary Elimination: Goal: Ability to avoid or minimize complications of infection will improve Outcome: Progressing Goal: Ability to achieve and maintain urine output will improve Outcome: Progressing Goal: Home care management will improve Outcome: Progressing   Problem: Education: Goal: Knowledge of General Education information will improve Description: Including pain rating scale, medication(s)/side effects and non-pharmacologic comfort measures Outcome: Progressing   Problem: Health Behavior/Discharge Planning: Goal: Ability to manage health-related needs will improve Outcome: Progressing   Problem: Clinical Measurements: Goal: Ability to maintain clinical measurements within normal limits will improve Outcome: Progressing Goal: Will remain free from infection Outcome: Progressing Goal: Diagnostic test results will improve Outcome: Progressing Goal: Respiratory complications will improve Outcome: Progressing Goal: Cardiovascular complication will be avoided Outcome: Progressing   Problem: Activity: Goal: Risk for activity intolerance will decrease Outcome: Progressing   Problem: Nutrition: Goal: Adequate nutrition will be maintained Outcome: Progressing   Problem: Coping: Goal: Level of anxiety will decrease Outcome: Progressing   Problem: Elimination: Goal: Will not experience complications related to bowel motility Outcome: Progressing Goal: Will not experience complications related to urinary  retention Outcome: Progressing   Problem: Pain Managment: Goal: General experience of comfort will improve and/or be controlled Outcome: Progressing   Problem: Safety: Goal: Ability to remain free from injury will improve Outcome: Progressing   Problem: Skin Integrity: Goal: Risk for impaired skin integrity will decrease Outcome: Progressing

## 2023-02-16 NOTE — Discharge Summary (Signed)
Date of admission: 02/15/2023  Date of discharge: 02/16/2023  Admission diagnosis: prostate cancer  Discharge diagnosis: same  Secondary diagnoses:  Patient Active Problem List   Diagnosis Date Noted   Prostate cancer (HCC) 02/15/2023   Acute calculous cholecystitis 12/23/2022   Acute cholecystitis 12/22/2022   Malignant neoplasm of prostate (HCC) 09/22/2022    Procedures performed: Procedure(s): XI ROBOTIC ASSISTED LAPAROSCOPIC RADICAL PROSTATECTOMY LEVEL 2 BILATERAL PELVIC LYMPHADENECTOMY  History and Physical: For full details, please see admission history and physical. Briefly, Joshua Bautista is a 53 y.o. year old patient with prostate cancer who underwent radical prostatectomy with bilateral lymph node dissection.   Hospital Course: Patient tolerated the procedure well.  He was then transferred to the floor after an uneventful PACU stay.  His hospital course was uncomplicated.  On POD#1 he had met discharge criteria: was eating a regular diet, was up and ambulating independently,  pain was well controlled, voiding via catheter, and was ready to for discharge.   Laboratory values:  Recent Labs    02/15/23 1545 02/16/23 0503  HGB 12.0* 11.3*  HCT 32.9* 33.2*   No results for input(s): "NA", "K", "CL", "CO2", "GLUCOSE", "BUN", "CREATININE", "CALCIUM" in the last 72 hours. No results for input(s): "LABPT", "INR" in the last 72 hours. No results for input(s): "LABURIN" in the last 72 hours. No results found for this or any previous visit.  Physical Exam  Gen: NAD Resp: Satting well on RA Card: Regular rate Abd: Soft, appropriately tender, ND, incision clean dry and intact GU: Foley catheter in place draining urine. Neuro: Alert   Disposition: Home  Discharge instruction: The patient was instructed to be ambulatory but told to refrain from heavy lifting, strenuous activity, or driving.   Discharge medications:  Allergies as of 02/16/2023       Reactions   Latex     Causes scratches on skin, rash from latex/ adhesive bandages        Medication List     TAKE these medications    docusate sodium 100 MG capsule Commonly known as: Colace Take 1 capsule (100 mg total) by mouth 2 (two) times daily.   sulfamethoxazole-trimethoprim 800-160 MG tablet Commonly known as: BACTRIM DS Take 1 tablet by mouth 2 (two) times daily for 3 days.   traMADol 50 MG tablet Commonly known as: Ultram Take 1-2 tablets (50-100 mg total) by mouth every 6 (six) hours as needed for moderate pain (pain score 4-6) or severe pain (pain score 7-10).        Followup:   Follow-up Information     [name], MD Follow up on 02/23/2023.   Specialty: Urology Why: 0900 AM Contact information: [address] [place] [address] [phone]

## 2023-02-16 NOTE — Progress Notes (Signed)
AVS reviewed w/ pt & wife - both verbalized an understanding. PIV removed as noted. PT dressing for d/c to home. Pt' has leg bag on and has a additional  foley  bag for home. Dressing for d/c to home

## 2023-02-16 NOTE — Progress Notes (Signed)
Patient ID: Joshua Bautista, male   DOB: Jun 30, 1970, 53 y.o.   MRN: [id number]  1 Day Post-Op Subjective: The patient is doing well.  No nausea or vomiting. Pain is adequately controlled.  Objective: Vital signs in last 24 hours: Temp:  [97.4 F (36.3 C)-98.5 F (36.9 C)] 98.2 F (36.8 C) (01/21 0610) Pulse Rate:  [59-79] 70 (01/21 0610) Resp:  [12-20] 18 (01/21 0610) BP: (141-170)/(78-97) 148/85 (01/21 0610) SpO2:  [93 %-100 %] 94 % (01/21 0610) Weight:  [93 kg] 93 kg (01/20 0946)  Intake/Output from previous day: 01/20 0701 - 01/21 0700 In: 4433.7 [P.O.:720; I.V.:2663.7; IV Piggyback:1050] Out: 2240 [Urine:1800; Drains:140; Blood:300] Intake/Output this shift: No intake/output data recorded.  Physical Exam:  General: Alert and oriented. CV: RRR Lungs: Clear bilaterally. GI: Soft, Nondistended. Incisions: Clean, dry, and intact Urine: Clear Extremities: Nontender, no erythema, no edema.  Lab Results: Recent Labs    02/15/23 1545 02/16/23 0503  HGB 12.0* 11.3*  HCT 32.9* 33.2*      Assessment/Plan: POD# 1 s/p robotic prostatectomy.  1) SL IVF 2) Ambulate, Incentive spirometry 3) Transition to oral pain medication 4) Dulcolax suppository 5) D/C pelvic drain 6) Plan for likely discharge later today   Joshua Bautista. MD   LOS: 0 days   Joshua Bautista 02/16/2023, 7:52 AM

## 2023-02-18 DIAGNOSIS — C61 Malignant neoplasm of prostate: Secondary | ICD-10-CM

## 2023-02-18 LAB — SURGICAL PATHOLOGY

## 2023-02-18 NOTE — Progress Notes (Signed)
RN spoke with patient since recent robotic prostatectomy.  Patient reports he is doing well and is aware of post op follow up.  RN provided education on post treatment PSA monitoring and survivorship referral placed. No additional needs at this time.

## 2023-03-01 ENCOUNTER — Encounter: Payer: Self-pay | Admitting: *Deleted

## 2023-03-04 ENCOUNTER — Encounter: Payer: Self-pay | Admitting: *Deleted

## 2023-03-04 ENCOUNTER — Inpatient Hospital Stay: Payer: BC Managed Care – PPO | Attending: Adult Health | Admitting: *Deleted

## 2023-03-04 DIAGNOSIS — C61 Malignant neoplasm of prostate: Secondary | ICD-10-CM

## 2023-03-04 NOTE — Progress Notes (Signed)
 SCP reviewed and completed. Pt will have post-tx PSA labs in March at [facility]. PCP annual visit in April. Last colonoscopy was 09/14/2022. Next due 09/13/2032.

## 2023-04-02 ENCOUNTER — Other Ambulatory Visit (HOSPITAL_COMMUNITY): Payer: Self-pay | Admitting: Internal Medicine

## 2023-04-02 ENCOUNTER — Ambulatory Visit (HOSPITAL_COMMUNITY)
Admission: RE | Admit: 2023-04-02 | Discharge: 2023-04-02 | Disposition: A | Discharge status: Home / Self Care | Source: Ambulatory Visit | Attending: Internal Medicine | Admitting: Internal Medicine

## 2023-04-02 DIAGNOSIS — I809 Phlebitis and thrombophlebitis of unspecified site: Secondary | ICD-10-CM | POA: Diagnosis not present

## 2023-04-02 DIAGNOSIS — I80201 Phlebitis and thrombophlebitis of unspecified deep vessels of right lower extremity: Secondary | ICD-10-CM | POA: Diagnosis not present

## 2023-04-07 DIAGNOSIS — I82819 Embolism and thrombosis of superficial veins of unspecified lower extremities: Secondary | ICD-10-CM | POA: Diagnosis not present

## 2023-05-11 DIAGNOSIS — C61 Malignant neoplasm of prostate: Secondary | ICD-10-CM | POA: Diagnosis not present

## 2023-05-18 DIAGNOSIS — N393 Stress incontinence (female) (male): Secondary | ICD-10-CM | POA: Diagnosis not present

## 2023-05-18 DIAGNOSIS — N5201 Erectile dysfunction due to arterial insufficiency: Secondary | ICD-10-CM | POA: Diagnosis not present

## 2023-05-18 DIAGNOSIS — C61 Malignant neoplasm of prostate: Secondary | ICD-10-CM | POA: Diagnosis not present

## 2023-06-03 DIAGNOSIS — Z8546 Personal history of malignant neoplasm of prostate: Secondary | ICD-10-CM | POA: Diagnosis not present

## 2023-06-03 DIAGNOSIS — Z9079 Acquired absence of other genital organ(s): Secondary | ICD-10-CM | POA: Diagnosis not present

## 2023-06-03 DIAGNOSIS — Z0000 Encounter for general adult medical examination without abnormal findings: Secondary | ICD-10-CM | POA: Diagnosis not present

## 2023-06-03 DIAGNOSIS — I82811 Embolism and thrombosis of superficial veins of right lower extremities: Secondary | ICD-10-CM | POA: Diagnosis not present

## 2023-07-08 DIAGNOSIS — I82819 Embolism and thrombosis of superficial veins of unspecified lower extremities: Secondary | ICD-10-CM | POA: Diagnosis not present

## 2023-07-08 DIAGNOSIS — C61 Malignant neoplasm of prostate: Secondary | ICD-10-CM | POA: Diagnosis not present

## 2023-07-21 DIAGNOSIS — D72819 Decreased white blood cell count, unspecified: Secondary | ICD-10-CM | POA: Diagnosis not present
